# Patient Record
Sex: Female | Born: 1970 | Race: Black or African American | Hispanic: No | Marital: Single | Smoking: Never smoker
Health system: Southern US, Community
[De-identification: ages and names within clinical notes are randomized; demographics above are authoritative.]

## PROBLEM LIST (undated history)

## (undated) DIAGNOSIS — S82142A Displaced bicondylar fracture of left tibia, initial encounter for closed fracture: Secondary | ICD-10-CM

## (undated) DIAGNOSIS — I10 Essential (primary) hypertension: Secondary | ICD-10-CM

## (undated) DIAGNOSIS — M1990 Unspecified osteoarthritis, unspecified site: Secondary | ICD-10-CM

## (undated) DIAGNOSIS — R011 Cardiac murmur, unspecified: Secondary | ICD-10-CM

---
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
## 1997-10-15 ENCOUNTER — Emergency Department (HOSPITAL_COMMUNITY): Admission: EM | Admit: 1997-10-15 | Discharge: 1997-10-15 | Payer: Self-pay | Admitting: Emergency Medicine

## 1997-10-27 ENCOUNTER — Inpatient Hospital Stay (HOSPITAL_COMMUNITY): Admission: AD | Admit: 1997-10-27 | Discharge: 1997-10-27 | Payer: Self-pay | Admitting: Obstetrics

## 1998-10-05 ENCOUNTER — Emergency Department (HOSPITAL_COMMUNITY): Admission: EM | Admit: 1998-10-05 | Discharge: 1998-10-05 | Payer: Self-pay | Admitting: Emergency Medicine

## 1998-12-14 ENCOUNTER — Inpatient Hospital Stay (HOSPITAL_COMMUNITY): Admission: AD | Admit: 1998-12-14 | Discharge: 1998-12-14 | Payer: Self-pay | Admitting: Obstetrics & Gynecology

## 1999-05-27 ENCOUNTER — Encounter: Payer: Self-pay | Admitting: *Deleted

## 1999-05-27 ENCOUNTER — Emergency Department (HOSPITAL_COMMUNITY): Admission: EM | Admit: 1999-05-27 | Discharge: 1999-05-27 | Payer: Self-pay | Admitting: *Deleted

## 1999-07-13 ENCOUNTER — Encounter: Admission: RE | Admit: 1999-07-13 | Discharge: 1999-10-11 | Payer: Self-pay | Admitting: Orthopedic Surgery

## 1999-10-04 ENCOUNTER — Inpatient Hospital Stay (HOSPITAL_COMMUNITY): Admission: EM | Admit: 1999-10-04 | Discharge: 1999-10-04 | Payer: Self-pay | Admitting: *Deleted

## 2002-04-09 HISTORY — PX: HIP SURGERY: SHX245

## 2003-11-17 ENCOUNTER — Inpatient Hospital Stay (HOSPITAL_COMMUNITY): Admission: AD | Admit: 2003-11-17 | Discharge: 2003-11-17 | Payer: Self-pay | Admitting: Obstetrics and Gynecology

## 2004-03-29 ENCOUNTER — Inpatient Hospital Stay (HOSPITAL_COMMUNITY): Admission: AD | Admit: 2004-03-29 | Discharge: 2004-03-29 | Payer: Self-pay | Admitting: *Deleted

## 2009-11-10 ENCOUNTER — Inpatient Hospital Stay (HOSPITAL_COMMUNITY): Admission: AD | Admit: 2009-11-10 | Discharge: 2009-11-10 | Payer: Self-pay | Admitting: Obstetrics & Gynecology

## 2009-11-10 ENCOUNTER — Ambulatory Visit: Payer: Self-pay | Admitting: Obstetrics and Gynecology

## 2009-11-24 ENCOUNTER — Ambulatory Visit: Payer: Self-pay | Admitting: Obstetrics and Gynecology

## 2009-12-22 ENCOUNTER — Inpatient Hospital Stay (HOSPITAL_COMMUNITY): Admission: AD | Admit: 2009-12-22 | Discharge: 2009-12-22 | Payer: Self-pay | Admitting: Obstetrics & Gynecology

## 2009-12-26 ENCOUNTER — Ambulatory Visit (HOSPITAL_COMMUNITY): Admission: RE | Admit: 2009-12-26 | Discharge: 2009-12-26 | Payer: Self-pay | Admitting: Obstetrics and Gynecology

## 2010-04-29 ENCOUNTER — Encounter: Payer: Self-pay | Admitting: Obstetrics and Gynecology

## 2010-04-30 ENCOUNTER — Encounter: Payer: Self-pay | Admitting: *Deleted

## 2010-04-30 ENCOUNTER — Encounter: Payer: Self-pay | Admitting: Obstetrics and Gynecology

## 2010-06-23 LAB — URINALYSIS, ROUTINE W REFLEX MICROSCOPIC
Bilirubin Urine: NEGATIVE
Glucose, UA: NEGATIVE mg/dL
Hgb urine dipstick: NEGATIVE
Ketones, ur: 15 mg/dL — AB
Protein, ur: NEGATIVE mg/dL
pH: 6.5 (ref 5.0–8.0)

## 2010-06-23 LAB — GC/CHLAMYDIA PROBE AMP, GENITAL: Chlamydia, DNA Probe: NEGATIVE

## 2010-06-23 LAB — CA 125: CA 125: 24.6 U/mL (ref 0.0–30.2)

## 2013-09-13 ENCOUNTER — Emergency Department (HOSPITAL_COMMUNITY)
Admission: EM | Admit: 2013-09-13 | Discharge: 2013-09-14 | Disposition: A | Discharge status: Home / Self Care | Payer: Medicaid Other | Attending: Emergency Medicine | Admitting: Emergency Medicine

## 2013-09-13 ENCOUNTER — Emergency Department (HOSPITAL_COMMUNITY): Payer: Medicaid Other

## 2013-09-13 ENCOUNTER — Encounter (HOSPITAL_COMMUNITY): Payer: Self-pay | Admitting: Emergency Medicine

## 2013-09-13 DIAGNOSIS — I10 Essential (primary) hypertension: Secondary | ICD-10-CM | POA: Diagnosis not present

## 2013-09-13 DIAGNOSIS — IMO0002 Reserved for concepts with insufficient information to code with codable children: Secondary | ICD-10-CM | POA: Insufficient documentation

## 2013-09-13 DIAGNOSIS — S8990XA Unspecified injury of unspecified lower leg, initial encounter: Secondary | ICD-10-CM | POA: Diagnosis present

## 2013-09-13 DIAGNOSIS — S82109A Unspecified fracture of upper end of unspecified tibia, initial encounter for closed fracture: Secondary | ICD-10-CM | POA: Diagnosis not present

## 2013-09-13 DIAGNOSIS — Y92410 Unspecified street and highway as the place of occurrence of the external cause: Secondary | ICD-10-CM | POA: Diagnosis not present

## 2013-09-13 DIAGNOSIS — S79919A Unspecified injury of unspecified hip, initial encounter: Secondary | ICD-10-CM | POA: Insufficient documentation

## 2013-09-13 DIAGNOSIS — Z9889 Other specified postprocedural states: Secondary | ICD-10-CM | POA: Insufficient documentation

## 2013-09-13 DIAGNOSIS — Z3202 Encounter for pregnancy test, result negative: Secondary | ICD-10-CM | POA: Diagnosis not present

## 2013-09-13 DIAGNOSIS — S79929A Unspecified injury of unspecified thigh, initial encounter: Secondary | ICD-10-CM

## 2013-09-13 DIAGNOSIS — T07XXXA Unspecified multiple injuries, initial encounter: Secondary | ICD-10-CM | POA: Insufficient documentation

## 2013-09-13 DIAGNOSIS — S82142A Displaced bicondylar fracture of left tibia, initial encounter for closed fracture: Secondary | ICD-10-CM

## 2013-09-13 DIAGNOSIS — Y9389 Activity, other specified: Secondary | ICD-10-CM | POA: Insufficient documentation

## 2013-09-13 DIAGNOSIS — S3981XA Other specified injuries of abdomen, initial encounter: Secondary | ICD-10-CM | POA: Insufficient documentation

## 2013-09-13 DIAGNOSIS — R110 Nausea: Secondary | ICD-10-CM | POA: Insufficient documentation

## 2013-09-13 DIAGNOSIS — S0993XA Unspecified injury of face, initial encounter: Secondary | ICD-10-CM | POA: Diagnosis not present

## 2013-09-13 DIAGNOSIS — S199XXA Unspecified injury of neck, initial encounter: Secondary | ICD-10-CM

## 2013-09-13 DIAGNOSIS — S99929A Unspecified injury of unspecified foot, initial encounter: Secondary | ICD-10-CM | POA: Diagnosis present

## 2013-09-13 DIAGNOSIS — S0990XA Unspecified injury of head, initial encounter: Secondary | ICD-10-CM | POA: Insufficient documentation

## 2013-09-13 HISTORY — DX: Essential (primary) hypertension: I10

## 2013-09-13 LAB — POC URINE PREG, ED: Preg Test, Ur: NEGATIVE

## 2013-09-13 MED ORDER — OXYCODONE-ACETAMINOPHEN 5-325 MG PO TABS
1.0000 | ORAL_TABLET | Freq: Once | ORAL | Status: AC
  Administered 2013-09-13: 1 via ORAL
  Filled 2013-09-13: qty 1
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
## 2013-09-13 NOTE — ED Notes (Signed)
Patient transported to CT 

## 2013-09-14 MED ORDER — OXYCODONE-ACETAMINOPHEN 5-325 MG PO TABS: 1.0000 | ORAL_TABLET | ORAL | Status: DC | PRN

## 2013-09-14 MED ORDER — IBUPROFEN 800 MG PO TABS: 800.0000 mg | ORAL_TABLET | Freq: Three times a day (TID) | ORAL | Status: DC

## 2013-09-14 NOTE — Discharge Instructions (Signed)
Call Dr. Greig Right office in the morning to see what time he wants you to come to the office. Return here as needed for problems.

## 2013-09-14 NOTE — ED Notes (Signed)
Ortho tech paged  

## 2013-09-14 NOTE — Progress Notes (Signed)
Orthopedic Tech Progress Note Patient Details:  Deanna Curtis 1970/11/12 [id number]  Ortho Devices Type of Ortho Device: Knee Immobilizer;Crutches;Knee Sleeve   Haskell Flirt 09/14/2013, 12:51 AM

## 2013-09-14 NOTE — ED Notes (Signed)
Pt refused vitals recheck, wanted to go to pediatrics ED to be with her daughter being seen.

## 2013-09-24 ENCOUNTER — Ambulatory Visit (HOSPITAL_COMMUNITY)
Admission: RE | Admit: 2013-09-24 | Discharge: 2013-09-24 | Disposition: A | Discharge status: Home / Self Care | Payer: Medicaid Other | Source: Ambulatory Visit | Attending: Cardiovascular Disease | Admitting: Cardiovascular Disease

## 2013-09-24 DIAGNOSIS — M79605 Pain in left leg: Secondary | ICD-10-CM

## 2013-09-24 DIAGNOSIS — Y929 Unspecified place or not applicable: Secondary | ICD-10-CM | POA: Insufficient documentation

## 2013-09-24 DIAGNOSIS — M79609 Pain in unspecified limb: Secondary | ICD-10-CM

## 2013-09-24 DIAGNOSIS — S82109A Unspecified fracture of upper end of unspecified tibia, initial encounter for closed fracture: Secondary | ICD-10-CM | POA: Insufficient documentation

## 2013-09-24 DIAGNOSIS — M7989 Other specified soft tissue disorders: Secondary | ICD-10-CM

## 2013-09-24 DIAGNOSIS — X58XXXA Exposure to other specified factors, initial encounter: Secondary | ICD-10-CM | POA: Insufficient documentation

## 2013-09-24 NOTE — ED Provider Notes (Signed)
Medical screening examination/treatment/procedure(s) were performed by non-physician practitioner and as supervising physician I was immediately available for consultation/collaboration.   EKG Interpretation None        Raya Mckinstry, MD 09/24/13 0014 

## 2013-09-24 NOTE — Progress Notes (Signed)
Venous Duplex Left Lowe Ext. Completed. Negative for DVT. Deanna Curtis, BS, RDMS, RVT

## 2013-09-25 ENCOUNTER — Telehealth (HOSPITAL_COMMUNITY): Payer: Self-pay | Admitting: *Deleted

## 2013-09-28 ENCOUNTER — Other Ambulatory Visit: Payer: Self-pay | Admitting: Orthopedic Surgery

## 2013-09-28 ENCOUNTER — Encounter (HOSPITAL_BASED_OUTPATIENT_CLINIC_OR_DEPARTMENT_OTHER): Payer: Self-pay | Admitting: *Deleted
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
## 2013-10-02 ENCOUNTER — Ambulatory Visit (HOSPITAL_BASED_OUTPATIENT_CLINIC_OR_DEPARTMENT_OTHER)
Admission: RE | Admit: 2013-10-02 | Discharge: 2013-10-03 | Disposition: A | Discharge status: Home / Self Care | Payer: Medicaid Other | Source: Ambulatory Visit | Attending: Orthopedic Surgery | Admitting: Orthopedic Surgery

## 2013-10-02 ENCOUNTER — Encounter (HOSPITAL_BASED_OUTPATIENT_CLINIC_OR_DEPARTMENT_OTHER): Payer: Medicaid Other | Admitting: Anesthesiology

## 2013-10-02 ENCOUNTER — Encounter (HOSPITAL_BASED_OUTPATIENT_CLINIC_OR_DEPARTMENT_OTHER)
Admission: RE | Disposition: A | Discharge status: Home / Self Care | Payer: Self-pay | Source: Ambulatory Visit | Attending: Orthopedic Surgery

## 2013-10-02 ENCOUNTER — Encounter (HOSPITAL_BASED_OUTPATIENT_CLINIC_OR_DEPARTMENT_OTHER): Payer: Self-pay

## 2013-10-02 ENCOUNTER — Other Ambulatory Visit: Payer: Self-pay

## 2013-10-02 ENCOUNTER — Ambulatory Visit (HOSPITAL_COMMUNITY): Payer: Medicaid Other

## 2013-10-02 ENCOUNTER — Ambulatory Visit (HOSPITAL_BASED_OUTPATIENT_CLINIC_OR_DEPARTMENT_OTHER): Payer: Medicaid Other | Admitting: Anesthesiology

## 2013-10-02 DIAGNOSIS — S82109A Unspecified fracture of upper end of unspecified tibia, initial encounter for closed fracture: Secondary | ICD-10-CM | POA: Diagnosis present

## 2013-10-02 DIAGNOSIS — Z96649 Presence of unspecified artificial hip joint: Secondary | ICD-10-CM | POA: Insufficient documentation

## 2013-10-02 DIAGNOSIS — I10 Essential (primary) hypertension: Secondary | ICD-10-CM | POA: Insufficient documentation

## 2013-10-02 DIAGNOSIS — S82142A Displaced bicondylar fracture of left tibia, initial encounter for closed fracture: Secondary | ICD-10-CM

## 2013-10-02 HISTORY — DX: Unspecified osteoarthritis, unspecified site: M19.90

## 2013-10-02 HISTORY — PX: ORIF TIBIA PLATEAU: SHX2132

## 2013-10-02 HISTORY — DX: Displaced bicondylar fracture of left tibia, initial encounter for closed fracture: S82.142A

## 2013-10-02 SURGERY — OPEN REDUCTION INTERNAL FIXATION (ORIF) TIBIAL PLATEAU
Anesthesia: General | Laterality: Left

## 2013-10-02 MED ORDER — BISOPROLOL-HYDROCHLOROTHIAZIDE 5-6.25 MG PO TABS
1.0000 | ORAL_TABLET | Freq: Every day | ORAL | Status: DC
  Administered 2013-10-02 (×2): 1 via ORAL

## 2013-10-02 MED ORDER — MIDAZOLAM HCL 2 MG/2ML IJ SOLN: 1.0000 mg | INTRAMUSCULAR | Status: DC | PRN

## 2013-10-02 MED ORDER — SENNA 8.6 MG PO TABS
1.0000 | ORAL_TABLET | Freq: Two times a day (BID) | ORAL | Status: DC
  Administered 2013-10-03: 8.6 mg via ORAL
  Filled 2013-10-02: qty 1

## 2013-10-02 MED ORDER — METHOCARBAMOL 500 MG PO TABS
500.0000 mg | ORAL_TABLET | Freq: Four times a day (QID) | ORAL | Status: DC
Start: 2013-10-02 — End: 2020-12-13

## 2013-10-02 MED ORDER — OXYCODONE HCL 5 MG PO TABS
5.0000 mg | ORAL_TABLET | Freq: Once | ORAL | Status: AC | PRN
  Administered 2013-10-02: 5 mg via ORAL

## 2013-10-02 MED ORDER — FENTANYL CITRATE 0.05 MG/ML IJ SOLN
INTRAMUSCULAR | Status: AC
  Filled 2013-10-02: qty 4

## 2013-10-02 MED ORDER — ONDANSETRON HCL 4 MG/2ML IJ SOLN: 4.0000 mg | Freq: Four times a day (QID) | INTRAMUSCULAR | Status: DC | PRN

## 2013-10-02 MED ORDER — PROMETHAZINE HCL 25 MG PO TABS: 25.0000 mg | ORAL_TABLET | Freq: Four times a day (QID) | ORAL | Status: DC | PRN

## 2013-10-02 MED ORDER — OXYCODONE-ACETAMINOPHEN 10-325 MG PO TABS: 1.0000 | ORAL_TABLET | Freq: Four times a day (QID) | ORAL | Status: DC | PRN

## 2013-10-02 MED ORDER — HYDROMORPHONE HCL PF 1 MG/ML IJ SOLN
INTRAMUSCULAR | Status: AC
  Filled 2013-10-02: qty 1

## 2013-10-02 MED ORDER — PROPOFOL 10 MG/ML IV BOLUS
INTRAVENOUS | Status: AC
  Filled 2013-10-02: qty 20

## 2013-10-02 MED ORDER — PROMETHAZINE HCL 25 MG/ML IJ SOLN
6.2500 mg | INTRAMUSCULAR | Status: DC | PRN
  Administered 2013-10-02: 12.5 mg via INTRAVENOUS
  Filled 2013-10-02 (×2): qty 1

## 2013-10-02 MED ORDER — ONDANSETRON HCL 4 MG/2ML IJ SOLN
INTRAMUSCULAR | Status: DC | PRN
  Administered 2013-10-02: 4 mg via INTRAVENOUS

## 2013-10-02 MED ORDER — FENTANYL CITRATE 0.05 MG/ML IJ SOLN
INTRAMUSCULAR | Status: AC
  Filled 2013-10-02: qty 6

## 2013-10-02 MED ORDER — METOCLOPRAMIDE HCL 5 MG PO TABS: 5.0000 mg | ORAL_TABLET | Freq: Three times a day (TID) | ORAL | Status: DC | PRN

## 2013-10-02 MED ORDER — CEFAZOLIN SODIUM-DEXTROSE 2-3 GM-% IV SOLR
2.0000 g | INTRAVENOUS | Status: AC
  Administered 2013-10-02: 2 g via INTRAVENOUS

## 2013-10-02 MED ORDER — ONDANSETRON HCL 4 MG PO TABS: 4.0000 mg | ORAL_TABLET | Freq: Four times a day (QID) | ORAL | Status: DC | PRN

## 2013-10-02 MED ORDER — MEDROXYPROGESTERONE ACETATE 150 MG/ML IM SUSP: 150.0000 mg | INTRAMUSCULAR | Status: DC

## 2013-10-02 MED ORDER — MIDAZOLAM HCL 2 MG/2ML IJ SOLN
INTRAMUSCULAR | Status: AC
  Filled 2013-10-02: qty 2

## 2013-10-02 MED ORDER — FENTANYL CITRATE 0.05 MG/ML IJ SOLN
INTRAMUSCULAR | Status: AC
  Filled 2013-10-02: qty 2

## 2013-10-02 MED ORDER — METHOCARBAMOL 500 MG PO TABS
500.0000 mg | ORAL_TABLET | Freq: Four times a day (QID) | ORAL | Status: DC | PRN
  Administered 2013-10-02 – 2013-10-03 (×2): 500 mg via ORAL
  Filled 2013-10-02 (×2): qty 1

## 2013-10-02 MED ORDER — HYDROMORPHONE HCL PF 1 MG/ML IJ SOLN
0.5000 mg | INTRAMUSCULAR | Status: DC | PRN
  Administered 2013-10-02 – 2013-10-03 (×4): 1 mg via INTRAVENOUS
  Filled 2013-10-02 (×4): qty 1

## 2013-10-02 MED ORDER — METOCLOPRAMIDE HCL 5 MG/ML IJ SOLN: 5.0000 mg | Freq: Three times a day (TID) | INTRAMUSCULAR | Status: DC | PRN

## 2013-10-02 MED ORDER — DOCUSATE SODIUM 100 MG PO CAPS
100.0000 mg | ORAL_CAPSULE | Freq: Two times a day (BID) | ORAL | Status: DC
  Administered 2013-10-03: 100 mg via ORAL
  Filled 2013-10-02: qty 1

## 2013-10-02 MED ORDER — METHOCARBAMOL 1000 MG/10ML IJ SOLN: 500.0000 mg | Freq: Four times a day (QID) | INTRAMUSCULAR | Status: DC | PRN

## 2013-10-02 MED ORDER — OXYCODONE HCL 5 MG/5ML PO SOLN: 5.0000 mg | Freq: Once | ORAL | Status: AC | PRN

## 2013-10-02 MED ORDER — SODIUM CHLORIDE 0.9 % IV SOLN: INTRAVENOUS | Status: DC

## 2013-10-02 MED ORDER — OXYCODONE-ACETAMINOPHEN 5-325 MG PO TABS
1.0000 | ORAL_TABLET | ORAL | Status: DC | PRN
  Administered 2013-10-03: 2 via ORAL
  Administered 2013-10-03: 1 via ORAL
  Filled 2013-10-02: qty 2
  Filled 2013-10-02: qty 1

## 2013-10-02 MED ORDER — RIVAROXABAN 10 MG PO TABS: 10.0000 mg | ORAL_TABLET | Freq: Every day | ORAL | Status: DC

## 2013-10-02 MED ORDER — CEFAZOLIN SODIUM 1-5 GM-% IV SOLN
INTRAVENOUS | Status: AC
  Filled 2013-10-02: qty 50

## 2013-10-02 MED ORDER — SENNA-DOCUSATE SODIUM 8.6-50 MG PO TABS: 2.0000 | ORAL_TABLET | Freq: Every day | ORAL | Status: DC

## 2013-10-02 MED ORDER — CEFAZOLIN SODIUM 1-5 GM-% IV SOLN
1.0000 g | Freq: Four times a day (QID) | INTRAVENOUS | Status: DC
  Administered 2013-10-02 – 2013-10-03 (×2): 1 g via INTRAVENOUS

## 2013-10-02 MED ORDER — OXYCODONE HCL 5 MG PO TABS
5.0000 mg | ORAL_TABLET | ORAL | Status: DC | PRN
  Administered 2013-10-02: 10 mg via ORAL
  Filled 2013-10-02: qty 2

## 2013-10-02 MED ORDER — CEFAZOLIN SODIUM-DEXTROSE 2-3 GM-% IV SOLR
INTRAVENOUS | Status: AC
  Filled 2013-10-02: qty 50

## 2013-10-02 MED ORDER — FENTANYL CITRATE 0.05 MG/ML IJ SOLN: 50.0000 ug | INTRAMUSCULAR | Status: DC | PRN

## 2013-10-02 MED ORDER — LACTATED RINGERS IV SOLN
INTRAVENOUS | Status: DC
  Administered 2013-10-02 (×2): via INTRAVENOUS

## 2013-10-02 MED ORDER — HYDROMORPHONE HCL PF 1 MG/ML IJ SOLN
0.2500 mg | INTRAMUSCULAR | Status: DC | PRN
  Administered 2013-10-02 (×3): 0.5 mg via INTRAVENOUS

## 2013-10-02 MED ORDER — PROPOFOL 10 MG/ML IV BOLUS
INTRAVENOUS | Status: DC | PRN
  Administered 2013-10-02: 160 mg via INTRAVENOUS

## 2013-10-02 MED ORDER — LIDOCAINE HCL (CARDIAC) 20 MG/ML IV SOLN
INTRAVENOUS | Status: DC | PRN
  Administered 2013-10-02: 60 mg via INTRAVENOUS

## 2013-10-02 MED ORDER — FENTANYL CITRATE 0.05 MG/ML IJ SOLN
INTRAMUSCULAR | Status: DC | PRN
  Administered 2013-10-02: 100 ug via INTRAVENOUS
  Administered 2013-10-02 (×8): 50 ug via INTRAVENOUS

## 2013-10-02 MED ORDER — BUPIVACAINE HCL (PF) 0.5 % IJ SOLN
INTRAMUSCULAR | Status: DC | PRN
  Administered 2013-10-02: 20 mL

## 2013-10-02 MED ORDER — MIDAZOLAM HCL 5 MG/5ML IJ SOLN
INTRAMUSCULAR | Status: DC | PRN
  Administered 2013-10-02: 2 mg via INTRAVENOUS

## 2013-10-02 MED ORDER — OXYCODONE HCL 5 MG PO TABS
ORAL_TABLET | ORAL | Status: AC
  Filled 2013-10-02: qty 1

## 2013-10-02 MED ORDER — BUPIVACAINE HCL (PF) 0.5 % IJ SOLN
INTRAMUSCULAR | Status: AC
  Filled 2013-10-02: qty 30

## 2013-10-02 MED ORDER — DEXAMETHASONE SODIUM PHOSPHATE 10 MG/ML IJ SOLN
INTRAMUSCULAR | Status: DC | PRN
  Administered 2013-10-02: 10 mg via INTRAVENOUS

## 2013-10-02 SURGICAL SUPPLY — 79 items
3.5mm LCP Medial Proximal Tibia Plate ×2 IMPLANT
APL SKNCLS STERI-STRIP NONHPOA (GAUZE/BANDAGES/DRESSINGS) ×1
BANDAGE ELASTIC 4 VELCRO ST LF (GAUZE/BANDAGES/DRESSINGS) ×1 IMPLANT
BANDAGE ELASTIC 6 VELCRO ST LF (GAUZE/BANDAGES/DRESSINGS) ×3 IMPLANT
BANDAGE ESMARK 6X9 LF (GAUZE/BANDAGES/DRESSINGS) ×1 IMPLANT
BENZOIN TINCTURE PRP APPL 2/3 (GAUZE/BANDAGES/DRESSINGS) ×3 IMPLANT
BIT DRILL SOLID 2.5X110 (BIT) ×2 IMPLANT
BIT DRILL SOLID 2.8X165 (BIT) ×2 IMPLANT
BLADE SURG 10 STRL SS (BLADE) ×3 IMPLANT
BLADE SURG 15 STRL LF DISP TIS (BLADE) ×3 IMPLANT
BLADE SURG 15 STRL SS (BLADE) ×6
BNDG CMPR 9X6 STRL LF SNTH (GAUZE/BANDAGES/DRESSINGS) ×1
BNDG ESMARK 6X9 LF (GAUZE/BANDAGES/DRESSINGS) ×3
CANISTER SUCT 1200ML W/VALVE (MISCELLANEOUS) IMPLANT
CLOSURE WOUND 1/2 X4 (GAUZE/BANDAGES/DRESSINGS) ×1
CUFF TOURNIQUET SINGLE 34IN LL (TOURNIQUET CUFF) ×2 IMPLANT
DECANTER SPIKE VIAL GLASS SM (MISCELLANEOUS) IMPLANT
DRAPE C-ARM 42X72 X-RAY (DRAPES) ×3 IMPLANT
DRAPE C-ARMOR (DRAPES) ×2 IMPLANT
DRAPE EXTREMITY T 121X128X90 (DRAPE) ×3 IMPLANT
DRAPE U-SHAPE 47X51 STRL (DRAPES) ×3 IMPLANT
DRSG PAD ABDOMINAL 8X10 ST (GAUZE/BANDAGES/DRESSINGS) ×3 IMPLANT
DURAPREP 26ML APPLICATOR (WOUND CARE) ×3 IMPLANT
ELECT REM PT RETURN 9FT ADLT (ELECTROSURGICAL) ×3
ELECTRODE REM PT RTRN 9FT ADLT (ELECTROSURGICAL) ×1 IMPLANT
GAUZE SPONGE 4X4 12PLY STRL (GAUZE/BANDAGES/DRESSINGS) ×3 IMPLANT
GLOVE BIO SURGEON STRL SZ 6.5 (GLOVE) ×1 IMPLANT
GLOVE BIO SURGEON STRL SZ8 (GLOVE) ×3 IMPLANT
GLOVE BIO SURGEONS STRL SZ 6.5 (GLOVE) ×1
GLOVE BIOGEL PI IND STRL 7.0 (GLOVE) IMPLANT
GLOVE BIOGEL PI IND STRL 8 (GLOVE) ×2 IMPLANT
GLOVE BIOGEL PI INDICATOR 7.0 (GLOVE) ×2
GLOVE BIOGEL PI INDICATOR 8 (GLOVE) ×4
GLOVE ORTHO TXT STRL SZ7.5 (GLOVE) ×3 IMPLANT
GOWN STRL REUS W/ TWL LRG LVL3 (GOWN DISPOSABLE) ×1 IMPLANT
GOWN STRL REUS W/ TWL XL LVL3 (GOWN DISPOSABLE) ×2 IMPLANT
GOWN STRL REUS W/TWL LRG LVL3 (GOWN DISPOSABLE) ×3
GOWN STRL REUS W/TWL XL LVL3 (GOWN DISPOSABLE) ×6
IMMOBILIZER KNEE 22 UNIV (SOFTGOODS) ×1 IMPLANT
IMMOBILIZER KNEE 24 THIGH 36 (MISCELLANEOUS) ×1 IMPLANT
IMMOBILIZER KNEE 24 UNIV (MISCELLANEOUS) ×3
NDL HYPO 25X1 1.5 SAFETY (NEEDLE) IMPLANT
NEEDLE HYPO 25X1 1.5 SAFETY (NEEDLE) IMPLANT
NS IRRIG 1000ML POUR BTL (IV SOLUTION) ×3 IMPLANT
PACK ARTHROSCOPY DSU (CUSTOM PROCEDURE TRAY) ×3 IMPLANT
PACK BASIN DAY SURGERY FS (CUSTOM PROCEDURE TRAY) ×3 IMPLANT
PENCIL BUTTON HOLSTER BLD 10FT (ELECTRODE) ×3 IMPLANT
SCREW CORTEX 3.5 32MM (Screw) ×2 IMPLANT
SCREW CORTEX 3.5 36MM (Screw) ×2 IMPLANT
SCREW LOCK CORT ST 3.5X32 (Screw) IMPLANT
SCREW LOCK CORT ST 3.5X36 (Screw) IMPLANT
SCREW LOCKING SLF TAP 3.5X70MM (Screw) ×2 IMPLANT
SCREW PELVIC CORT ST 3.5X70 (Screw) IMPLANT
SCREW SELF TAP 3.5 70M (Screw) ×2 IMPLANT
SCREW SELF TAP 65MM (Screw) ×6 IMPLANT
SHEET MEDIUM DRAPE 40X70 STRL (DRAPES) ×3 IMPLANT
SLEEVE SCD COMPRESS KNEE MED (MISCELLANEOUS) ×3 IMPLANT
SPLINT FAST PLASTER 5X30 (CAST SUPPLIES)
SPLINT PLASTER CAST FAST 5X30 (CAST SUPPLIES) IMPLANT
SPONGE LAP 18X18 X RAY DECT (DISPOSABLE) ×4 IMPLANT
STAPLER VISISTAT 35W (STAPLE) IMPLANT
STRIP CLOSURE SKIN 1/2X4 (GAUZE/BANDAGES/DRESSINGS) ×2 IMPLANT
SUCTION FRAZIER TIP 10 FR DISP (SUCTIONS) ×2 IMPLANT
SUT ETHILON 3 0 PS 1 (SUTURE) IMPLANT
SUT ETHILON 4 0 PS 2 18 (SUTURE) IMPLANT
SUT MNCRL AB 4-0 PS2 18 (SUTURE) IMPLANT
SUT VIC AB 0 CT1 27 (SUTURE) ×3
SUT VIC AB 0 CT1 27XBRD ANBCTR (SUTURE) ×1 IMPLANT
SUT VIC AB 2-0 SH 18 (SUTURE) IMPLANT
SUT VIC AB 3-0 SH 27 (SUTURE)
SUT VIC AB 3-0 SH 27X BRD (SUTURE) IMPLANT
SUT VICRYL 3-0 CR8 SH (SUTURE) ×3 IMPLANT
SUT VICRYL 4-0 PS2 18IN ABS (SUTURE) ×4 IMPLANT
SYR BULB IRRIGATION 50ML (SYRINGE) ×3 IMPLANT
SYR CONTROL 10ML LL (SYRINGE) IMPLANT
TUBE CONNECTING 20'X1/4 (TUBING) ×1
TUBE CONNECTING 20X1/4 (TUBING) ×1 IMPLANT
UNDERPAD 30X30 INCONTINENT (UNDERPADS AND DIAPERS) ×3 IMPLANT
YANKAUER SUCT BULB TIP NO VENT (SUCTIONS) IMPLANT

## 2013-10-02 NOTE — Discharge Instructions (Signed)
Diet: As you were doing prior to hospitalization  ° °Shower:  May shower but keep the wounds dry, use an occlusive plastic wrap, NO SOAKING IN TUB.  If the bandage gets wet, change with a clean dry gauze. ° °Dressing:  You may change your dressing 3-5 days after surgery.  Then change the dressing daily with sterile gauze dressing.   ° °There are sticky tapes (steri-strips) on your wounds and all the stitches are absorbable.  Leave the steri-strips in place when changing your dressings, they will peel off with time, usually 2-3 weeks. ° °Activity:  Increase activity slowly as tolerated, but follow the weight bearing instructions below.  No lifting or driving for 6 weeks. ° °Weight Bearing:   Non weight bearing.   ° °To prevent constipation: you may use a stool softener such as - ° °Colace (over the counter) 100 mg by mouth twice a day  °Drink plenty of fluids (prune juice may be helpful) and high fiber foods °Miralax (over the counter) for constipation as needed.   ° °Itching:  If you experience itching with your medications, try taking only a single pain pill, or even half a pain pill at a time.  You may take up to 10 pain pills per day, and you can also use benadryl over the counter for itching or also to help with sleep.  ° °Precautions:  If you experience chest pain or shortness of breath - call 911 immediately for transfer to the hospital emergency department!! ° °If you develop a fever greater that 101 F, purulent drainage from wound, increased redness or drainage from wound, or calf pain -- Call the office at [phone]                                                °Follow- Up Appointment:  Please call for an appointment to be seen in 2 weeks [place] - [phone] ° ° ° ° ° °

## 2013-10-02 NOTE — Anesthesia Postprocedure Evaluation (Signed)
  Anesthesia Post-op Note  Patient: Deanna Curtis  Procedure(s) Performed: Procedure(s): LEFT OPEN REDUCTION INTERNAL FIXATION (ORIF) TIBIAL PLATEAU (Left)  Patient Location: PACU  Anesthesia Type:General  Level of Consciousness: awake and alert   Airway and Oxygen Therapy: Patient Spontanous Breathing  Post-op Pain: mild  Post-op Assessment: Post-op Vital signs reviewed  Post-op Vital Signs: stable  Last Vitals:  Filed Vitals:   10/02/13 1615  BP: 178/107  Pulse: 79  Temp:   Resp: 20    Complications: No apparent anesthesia complications

## 2013-10-02 NOTE — Anesthesia Preprocedure Evaluation (Addendum)
Anesthesia Evaluation  Patient identified by MRN, date of birth, ID band Patient awake    Reviewed: Allergy & Precautions, H&P , NPO status , Patient's Chart, lab work & pertinent test results  History of Anesthesia Complications Negative for: history of anesthetic complications  Airway Mallampati: I  Neck ROM: Full    Dental  (+) Teeth Intact   Pulmonary neg pulmonary ROS,  breath sounds clear to auscultation        Cardiovascular hypertension, Rhythm:Regular Rate:Normal     Neuro/Psych negative neurological ROS     GI/Hepatic negative GI ROS, Neg liver ROS,   Endo/Other    Renal/GU negative Renal ROS     Musculoskeletal   Abdominal   Peds  Hematology   Anesthesia Other Findings   Reproductive/Obstetrics                         Anesthesia Physical Anesthesia Plan  ASA: II  Anesthesia Plan: General   Post-op Pain Management:    Induction: Intravenous  Airway Management Planned: LMA  Additional Equipment:   Intra-op Plan:   Post-operative Plan: Extubation in OR  Informed Consent: I have reviewed the patients History and Physical, chart, labs and discussed the procedure including the risks, benefits and alternatives for the proposed anesthesia with the patient or authorized representative who has indicated his/her understanding and acceptance.   Dental advisory given  Plan Discussed with: CRNA and Surgeon  Anesthesia Plan Comments:         Anesthesia Quick Evaluation

## 2013-10-02 NOTE — Transfer of Care (Signed)
Immediate Anesthesia Transfer of Care Note  Patient: Deanna Curtis  Procedure(s) Performed: Procedure(s): LEFT OPEN REDUCTION INTERNAL FIXATION (ORIF) TIBIAL PLATEAU (Left)  Patient Location: PACU  Anesthesia Type:General  Level of Consciousness: sedated  Airway & Oxygen Therapy: Patient Spontanous Breathing and Patient connected to face mask oxygen  Post-op Assessment: Report given to PACU RN and Post -op Vital signs reviewed and stable  Post vital signs: Reviewed and stable  Complications: No apparent anesthesia complications

## 2013-10-02 NOTE — Anesthesia Procedure Notes (Signed)
Procedure Name: LMA Insertion Date/Time: 10/02/2013 1:36 PM Performed by: Burna CashONRAD, JOSEPH C Pre-anesthesia Checklist: Patient identified, Emergency Drugs available, Suction available and Patient being monitored Patient Re-evaluated:Patient Re-evaluated prior to inductionOxygen Delivery Method: Circle System Utilized Preoxygenation: Pre-oxygenation with 100% oxygen Intubation Type: IV induction Ventilation: Mask ventilation without difficulty LMA: LMA inserted LMA Size: 4.0 Number of attempts: 1 Airway Equipment and Method: bite block Placement Confirmation: positive ETCO2 Tube secured with: Tape Dental Injury: Teeth and Oropharynx as per pre-operative assessment

## 2013-10-02 NOTE — Op Note (Signed)
10/02/2013  3:04 PM  PATIENT:  Deanna Curtis    PRE-OPERATIVE DIAGNOSIS:  Left bicondylar tibial plateau fracture  POST-OPERATIVE DIAGNOSIS:  Same  PROCEDURE:  Open reduction internal fixation left bicondylar tibial plateau fracture  SURGEON:  Eulas PostLANDAU,Taliya Mcclard P, MD  PHYSICIAN ASSISTANT: Janace LittenBrandon Parry, OPA-C, present and scrubbed throughout the case, critical for completion in a timely fashion, and for retraction, instrumentation, and closure.  ANESTHESIA:   General  PREOPERATIVE INDICATIONS:  Deanna Curtis is a  43 y.o. female with a diagnosis of left bicondylar tibial plateau fracture who failed conservative measures and elected for surgical management.  She had significant displacement, particularly on the midline.  The risks benefits and alternatives were discussed with the patient preoperatively including but not limited to the risks of infection, bleeding, nerve injury, cardiopulmonary complications, the need for revision surgery, among others, and the patient was willing to proceed. We also discussed the risk for posttraumatic arthritis, progression of knee pain, the need for further surgery, among others.  OPERATIVE IMPLANTS: Synthes 3.5 mm medial proximal tibial locking plate, T-shaped  OPERATIVE FINDINGS: Displaced medial condyle fracture, extending in the lateral side.  OPERATIVE PROCEDURE: The patient was brought to the operating room and placed in the supine position. General anesthesia was administered. IV antibiotics were given. The left lower extremity was prepped and draped in usual sterile fashion. Time out was performed. The knee itself had a fair amount of recurrent bottom from the fracture site prior to fixation.  Medial incision was made, dissection carried down up as anserine tendons were exposed, and the superficial layer investing the sartorius, and gracilis and semitendinosus was incised, however I left these tendons in place. The incision was made proximal to the  tendon insertion. I then made a deeper longitudinal incision through the layer below this, exposing the fracture site. The fracture was booked open with the osteotome, and I used a Cobb elevator going through the tibia across to the anteromedial aspect of the lateral tibial plateau. I elevated the depressed section of the lateral plateau under fluoroscopic vision.  I then reduced to the medial plateau, applied a medial plate, secured it with a K wire initially, confirm the height and position on AP and lateral views, and then secured the plate distally with a cortical screw, compressing it down over the as anserine tendons and the superficial tissue.  I then placed a cortical screw proximally through the locking jig, securing the head of the plate down to the proximal tibia.  Confirmation of position of the fracture the plate was made under multiple views, AP, lateral, internal, and external obliques using fluoroscopy, and then I completed the fixation of the fracture with locking screws proximally, and a cortical screw disc ability, followed by a locking screw in the kickstand screw. I changed the initial nonlocking proximal screw out to a low contour locking screw.  Excellent overall fixation was achieved, final C-arm pictures were taken, wounds were irrigated copiously, and I had already repaired the deepest layer of the fascia with Vicryl, and I also already repaired the as anserine layer with Vicryl, and then repaired the subcutaneous tissue with Vicryl with Monocryl and Steri-Strips for the skin. She was injected with Marcaine. She was then awakened and returned to the PACU in stable and satisfactory condition. Sterile gauze was also applied as well as a knee immobilizer. There were no complications and she tolerated the procedure well. She will nonweightbearing for a period of about 2 months.
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
## 2013-10-03 DIAGNOSIS — S82109A Unspecified fracture of upper end of unspecified tibia, initial encounter for closed fracture: Secondary | ICD-10-CM | POA: Diagnosis not present

## 2013-10-05 ENCOUNTER — Encounter (HOSPITAL_BASED_OUTPATIENT_CLINIC_OR_DEPARTMENT_OTHER): Payer: Self-pay | Admitting: Orthopedic Surgery

## 2013-10-06 LAB — POCT I-STAT, CHEM 8
BUN: 9 mg/dL (ref 6–23)
Calcium, Ion: 1.27 mmol/L — ABNORMAL HIGH (ref 1.12–1.23)
Chloride: 107 mEq/L (ref 96–112)
Creatinine, Ser: 0.7 mg/dL (ref 0.50–1.10)
Glucose, Bld: 95 mg/dL (ref 70–99)
HEMATOCRIT: 41 % (ref 36.0–46.0)
HEMOGLOBIN: 13.9 g/dL (ref 12.0–15.0)
POTASSIUM: 3.9 meq/L (ref 3.7–5.3)
SODIUM: 142 meq/L (ref 137–147)
TCO2: 22 mmol/L (ref 0–100)

## 2017-06-24 ENCOUNTER — Other Ambulatory Visit: Payer: Self-pay | Admitting: Pain Medicine

## 2017-06-24 DIAGNOSIS — M25559 Pain in unspecified hip: Secondary | ICD-10-CM

## 2017-06-24 DIAGNOSIS — M545 Low back pain: Secondary | ICD-10-CM

## 2018-06-17 ENCOUNTER — Ambulatory Visit: Payer: Self-pay | Admitting: Adult Health

## 2018-06-20 ENCOUNTER — Ambulatory Visit
Admission: RE | Admit: 2018-06-20 | Discharge: 2018-06-20 | Disposition: A | Discharge status: Home / Self Care | Payer: 59 | Attending: Adult Health | Admitting: Adult Health

## 2018-06-20 ENCOUNTER — Ambulatory Visit
Admission: RE | Admit: 2018-06-20 | Discharge: 2018-06-20 | Disposition: A | Discharge status: Home / Self Care | Payer: 59 | Source: Ambulatory Visit | Attending: Adult Health | Admitting: Adult Health

## 2018-06-20 ENCOUNTER — Ambulatory Visit (INDEPENDENT_AMBULATORY_CARE_PROVIDER_SITE_OTHER): Payer: PRIVATE HEALTH INSURANCE | Admitting: Adult Health

## 2018-06-20 ENCOUNTER — Encounter: Payer: Self-pay | Admitting: Adult Health

## 2018-06-20 ENCOUNTER — Other Ambulatory Visit: Payer: Self-pay

## 2018-06-20 VITALS — BP 142/88 | HR 82 | Resp 16 | Ht 67.0 in | Wt 151.0 lb

## 2018-06-20 DIAGNOSIS — R059 Cough, unspecified: Secondary | ICD-10-CM

## 2018-06-20 DIAGNOSIS — R05 Cough: Secondary | ICD-10-CM

## 2018-06-20 DIAGNOSIS — H6121 Impacted cerumen, right ear: Secondary | ICD-10-CM | POA: Diagnosis not present

## 2018-06-20 DIAGNOSIS — J45909 Unspecified asthma, uncomplicated: Secondary | ICD-10-CM

## 2018-06-20 DIAGNOSIS — J4540 Moderate persistent asthma, uncomplicated: Secondary | ICD-10-CM | POA: Diagnosis not present

## 2018-06-20 MED ORDER — MONTELUKAST SODIUM 10 MG PO TABS: 10.0000 mg | ORAL_TABLET | Freq: Every day | ORAL | 1 refills | Status: AC

## 2018-06-20 MED ORDER — ALBUTEROL SULFATE HFA 108 (90 BASE) MCG/ACT IN AERS: 2.0000 | INHALATION_SPRAY | Freq: Four times a day (QID) | RESPIRATORY_TRACT | 0 refills | Status: AC | PRN

## 2018-06-20 MED ORDER — FLUTICASONE FUROATE-VILANTEROL 200-25 MCG/INH IN AEPB: 1.0000 | INHALATION_SPRAY | Freq: Every day | RESPIRATORY_TRACT | 2 refills | Status: DC

## 2018-06-20 MED ORDER — CARBAMIDE PEROXIDE 6.5 % OT SOLN: 5.0000 [drp] | Freq: Two times a day (BID) | OTIC | 0 refills | Status: DC

## 2018-06-20 NOTE — Progress Notes (Signed)
[facility] [address] [place], [place] [address]  Pulmonary Sleep Medicine   Office Visit Note  Patient Name: Deanna Curtis DOB: 07/10/1970 MRN [id number]  Date of Service: 06/20/2018  Complaints/HPI: Pt is here to establish care.  She has a history of asthma, htn and seasonal allergies.  She reports coughing intermittently for 3 years.  She has been treated for allergies and GERD.  She complains that she feels PND when she is laying down.  She is not currently taking any OTC medications.   ROS  General: (-) fever, (-) chills, (-) night sweats, (-) weakness Skin: (-) rashes, (-) itching,. Eyes: (-) visual changes, (-) redness, (-) itching. Nose and Sinuses: (-) nasal stuffiness or itchiness, (-) postnasal drip, (-) nosebleeds, (-) sinus trouble. Mouth and Throat: (-) sore throat, (-) hoarseness. Neck: (-) swollen glands, (-) enlarged thyroid, (-) neck pain. Respiratory: + cough, (-) bloody sputum, - shortness of breath, + wheezing. Cardiovascular: - ankle swelling, (-) chest pain. Lymphatic: (-) lymph node enlargement. Neurologic: (-) numbness, (-) tingling. Psychiatric: (-) anxiety, (-) depression   Current Medication: Outpatient Encounter Medications as of 06/20/2018  Medication Sig  . oxyCODONE-acetaminophen (PERCOCET/ROXICET) 5-325 MG tablet Take by mouth.  . bisoprolol-hydrochlorothiazide (ZIAC) 5-6.25 MG per tablet Take 1 tablet by mouth at bedtime.  . methocarbamol (ROBAXIN) 500 MG tablet Take 1 tablet (500 mg total) by mouth 4 (four) times daily. (Patient not taking: Reported on 06/20/2018)  . oxyCODONE-acetaminophen (PERCOCET) 10-325 MG per tablet Take 1-2 tablets by mouth every 6 (six) hours as needed for pain. MAXIMUM TOTAL ACETAMINOPHEN DOSE IS 4000 MG PER DAY (Patient not taking: Reported on 06/20/2018)  . promethazine (PHENERGAN) 25 MG tablet Take 1 tablet (25 mg total) by mouth every 6 (six) hours as needed for nausea or vomiting.  . [DISCONTINUED]  Aspirin-Salicylamide-Caffeine (BC HEADACHE POWDER PO) Take 1 packet by mouth 2 (two) times daily as needed (migraines).  . [DISCONTINUED] medroxyPROGESTERone (DEPO-PROVERA) 150 MG/ML injection Inject 150 mg into the muscle every 3 (three) months. Last injection 09/08/13  . [DISCONTINUED] rivaroxaban (XARELTO) 10 MG TABS tablet Take 1 tablet (10 mg total) by mouth daily. (Patient not taking: Reported on 06/20/2018)  . [DISCONTINUED] sennosides-docusate sodium (SENOKOT-S) 8.6-50 MG tablet Take 2 tablets by mouth daily. (Patient not taking: Reported on 06/20/2018)   No facility-administered encounter medications on file as of 06/20/2018.     Surgical History: Past Surgical History:  Procedure Laterality Date  . HIP ARTHROSCOPY  1987   lt   . HIP SURGERY  2004   total hip-left  . ORIF TIBIA PLATEAU Left 10/02/2013   Procedure: LEFT OPEN REDUCTION INTERNAL FIXATION (ORIF) TIBIAL PLATEAU;  Surgeon: Eulas Post, MD;  Location: [facility];  Service: Orthopedics;  Laterality: Left;    Medical History: Past Medical History:  Diagnosis Date  . Arthritis   . Hypertension   . Tibial plateau fracture, left 10/02/2013    Family History: Family History  Problem Relation Age of Onset  . Diabetes Mother   . Hypertension Mother   . Brain cancer Mother     Social History: Social History   Socioeconomic History  . Marital status: Single    Spouse name: Not on file  . Number of children: Not on file  . Years of education: Not on file  . Highest education level: Not on file  Occupational History  . Not on file  Social Needs  . Financial resource strain: Not on file  .  Food insecurity:    Worry: Not on file    Inability: Not on file  . Transportation needs:    Medical: Not on file    Non-medical: Not on file  Tobacco Use  . Smoking status: Never Smoker  . Smokeless tobacco: Never Used  Substance and Sexual Activity  . Alcohol use: Yes    Comment: social  . Drug use: No   . Sexual activity: Not on file  Lifestyle  . Physical activity:    Days per week: Not on file    Minutes per session: Not on file  . Stress: Not on file  Relationships  . Social connections:    Talks on phone: Not on file    Gets together: Not on file    Attends religious service: Not on file    Active member of club or organization: Not on file    Attends meetings of clubs or organizations: Not on file    Relationship status: Not on file  . Intimate partner violence:    Fear of current or ex partner: Not on file    Emotionally abused: Not on file    Physically abused: Not on file    Forced sexual activity: Not on file  Other Topics Concern  . Not on file  Social History Narrative  . Not on file    Vital Signs: Blood pressure (!) 142/88, pulse 82, resp. rate 16, height  (1.702 m), weight 151 lb (68.5 kg), SpO2 99 %.  Examination: General Appearance: The patient is well-developed, well-nourished, and in no distress. Skin: Gross inspection of skin unremarkable. Head: normocephalic, no gross deformities. Eyes: no gross deformities noted. ENT: ears appear grossly normal no exudates. Neck: Supple. No thyromegaly. No LAD. Respiratory: mild expiratory wheez. Cardiovascular: Normal S1 and S2 without murmur or rub. Extremities: No cyanosis. pulses are equal. Neurologic: Alert and oriented. No involuntary movements.  LABS: No results found for this or any previous visit (from the past 2160 hour(s)).  Radiology: Dg Tibia/fibula Left  Result Date: 10/02/2013 CLINICAL DATA:  ORIF tibia EXAM: LEFT TIBIA AND FIBULA - 2 VIEW; DG C-ARM 61-120 MIN COMPARISON:  CT 09/13/2013 FINDINGS: 4 fluoroscopic spot images document Interval lateral plate and screw fixation of the tibial plateau fracture, fragments projecting in near anatomic alignment. IMPRESSION: 1. Internal fixation of tibial plateau fracture Electronically Signed   By: Oley Balm M.D.   On: 10/02/2013 15:48   Dg C-arm  1-60 Min  Result Date: 10/02/2013 CLINICAL DATA:  ORIF tibia EXAM: LEFT TIBIA AND FIBULA - 2 VIEW; DG C-ARM 61-120 MIN COMPARISON:  CT 09/13/2013 FINDINGS: 4 fluoroscopic spot images document Interval lateral plate and screw fixation of the tibial plateau fracture, fragments projecting in near anatomic alignment. IMPRESSION: 1. Internal fixation of tibial plateau fracture Electronically Signed   By: Oley Balm M.D.   On: 10/02/2013 15:48    No results found.  No results found.    Assessment and Plan: Patient Active Problem List   Diagnosis Date Noted  . Tibial plateau fracture, left 10/02/2013   1. Moderate persistent asthma, unspecified whether complicated Patient's albuterol inhaler refilled at this time.  We will schedule her for pulmonary function test in the coming weeks.  Also going to add Brio Ellipta to patient's regimen for a combined inhaled corticosteroid with long-acting beta-2 agonist.  Will follow in a few weeks to see if this has helped her cough. - fluticasone furoate-vilanterol (BREO ELLIPTA) 200-25 MCG/INH AEPB; Inhale 1 puff  into the lungs daily.  Dispense: 60 each; Refill: 2 - albuterol (PROVENTIL HFA;VENTOLIN HFA) 108 (90 Base) MCG/ACT inhaler; Inhale 2 puffs into the lungs every 6 (six) hours as needed for wheezing or shortness of breath.  Dispense: 1 Inhaler; Refill: 0 - Pulmonary Function Test; Future  2. Cough Will get chest ray as patient has not had 1 in quite some time. - DG Chest 2 View; Future  3. Asthma due to seasonal allergies We will provide patient prescription for Singulair for allergies.  We discussed her use medication daily. - montelukast (SINGULAIR) 10 MG tablet; Take 1 tablet (10 mg total) by mouth at bedtime.  Dispense: 90 tablet; Refill: 1  4. Impacted cerumen of right ear Debrox drops sent to pharmacy.  Instructed patient on how to use. - carbamide peroxide (DEBROX) 6.5 % OTIC solution; Place 5 drops into the right ear 2 (two) times  daily.  Dispense: 15 mL; Refill: 0   General Counseling: I have discussed the findings of the evaluation and examination with Janice.  I have also discussed any further diagnostic evaluation thatmay be needed or ordered today. Sherah verbalizes understanding of the findings of todays visit. We also reviewed her medications today and discussed drug interactions and side effects including but not limited excessive drowsiness and altered mental states. We also discussed that there is always a risk not just to her but also people around her. she has been encouraged to call the office with any questions or concerns that should arise related to todays visit.    Time spent: 35 This patient was seen by Blima Ledger AGNP-C in Collaboration with Dr. Freda Munro as a part of collaborative care agreement.   I have personally obtained a history, examined the patient, evaluated laboratory and imaging results, formulated the assessment and plan and placed orders.    Yevonne Pax, MD [facility] Pulmonary and Critical Care Sleep medicine

## 2018-06-20 NOTE — Patient Instructions (Signed)
Asthma, Adult    Asthma is a long-term (chronic) condition in which the airways get tight and narrow. The airways are the breathing passages that lead from the nose and mouth down into the lungs. A person with asthma will have times when symptoms get worse. These are called asthma attacks. They can cause coughing, whistling sounds when you breathe (wheezing), shortness of breath, and chest pain. They can make it hard to breathe. There is no cure for asthma, but medicines and lifestyle changes can help control it.  There are many things that can bring on an asthma attack or make asthma symptoms worse (triggers). Common triggers include:  · Mold.  · Dust.  · Cigarette smoke.  · Cockroaches.  · Things that can cause allergy symptoms (allergens). These include animal skin flakes (dander) and pollen from trees or grass.  · Things that pollute the air. These may include household cleaners, wood smoke, smog, or chemical odors.  · Cold air, weather changes, and wind.  · Crying or laughing hard.  · Stress.  · Certain medicines or drugs.  · Certain foods such as dried fruit, potato chips, and grape juice.  · Infections, such as a cold or the flu.  · Certain medical conditions or diseases.  · Exercise or tiring activities.  Asthma may be treated with medicines and by staying away from the things that cause asthma attacks. Types of medicines may include:  · Controller medicines. These help prevent asthma symptoms. They are usually taken every day.  · Fast-acting reliever or rescue medicines. These quickly relieve asthma symptoms. They are used as needed and provide short-term relief.  · Allergy medicines if your attacks are brought on by allergens.  · Medicines to help control the body's defense (immune) system.  Follow these instructions at home:  Avoiding triggers in your home  · Change your heating and air conditioning filter often.  · Limit your use of fireplaces and wood stoves.  · Get rid of pests (such as roaches and  mice) and their droppings.  · Throw away plants if you see mold on them.  · Clean your floors. Dust regularly. Use cleaning products that do not smell.  · Have someone vacuum when you are not home. Use a vacuum cleaner with a HEPA filter if possible.  · Replace carpet with wood, tile, or vinyl flooring. Carpet can trap animal skin flakes and dust.  · Use allergy-proof pillows, mattress covers, and box spring covers.  · Wash bed sheets and blankets every week in hot water. Dry them in a dryer.  · Keep your bedroom free of any triggers.   · Avoid pets and keep windows closed when things that cause allergy symptoms are in the air.  · Use blankets that are made of polyester or cotton.  · Clean bathrooms and kitchens with bleach. If possible, have someone repaint the walls in these rooms with mold-resistant paint. Keep out of the rooms that are being cleaned and painted.  · Wash your hands often with soap and water. If soap and water are not available, use hand sanitizer.  · Do not allow anyone to smoke in your home.  General instructions  · Take over-the-counter and prescription medicines only as told by your doctor.  ? Talk with your doctor if you have questions about how or when to take your medicines.  ? Make note if you need to use your medicines more often than usual.  · Do not use any products that   contain nicotine or tobacco, such as cigarettes and e-cigarettes. If you need help quitting, ask your doctor.  · Stay away from secondhand smoke.  · Avoid doing things outdoors when allergen counts are high and when air quality is low.  · Wear a ski mask when doing outdoor activities in the winter. The mask should cover your nose and mouth. Exercise indoors on cold days if you can.  · Warm up before you exercise. Take time to cool down after exercise.  · Use a peak flow meter as told by your doctor. A peak flow meter is a tool that measures how well the lungs are working.  · Keep track of the peak flow meter's readings.  Write them down.  · Follow your asthma action plan. This is a written plan for taking care of your asthma and treating your attacks.  · Make sure you get all the shots (vaccines) that your doctor recommends. Ask your doctor about a flu shot and a pneumonia shot.  · Keep all follow-up visits as told by your doctor. This is important.  Contact a doctor if:  · You have wheezing, shortness of breath, or a cough even while taking medicine to prevent attacks.  · The mucus you cough up (sputum) is thicker than usual.  · The mucus you cough up changes from clear or white to yellow, green, gray, or bloody.  · You have problems from the medicine you are taking, such as:  ? A rash.  ? Itching.  ? Swelling.  ? Trouble breathing.  · You need reliever medicines more than 2-3 times a week.  · Your peak flow reading is still at 50-79% of your personal best after following the action plan for 1 hour.  · You have a fever.  Get help right away if:  · You seem to be worse and are not responding to medicine during an asthma attack.  · You are short of breath even at rest.  · You get short of breath when doing very little activity.  · You have trouble eating, drinking, or talking.  · You have chest pain or tightness.  · You have a fast heartbeat.  · Your lips or fingernails start to turn blue.  · You are light-headed or dizzy, or you faint.  · Your peak flow is less than 50% of your personal best.  · You feel too tired to breathe normally.  Summary  · Asthma is a long-term (chronic) condition in which the airways get tight and narrow. An asthma attack can make it hard to breathe.  · Asthma cannot be cured, but medicines and lifestyle changes can help control it.  · Make sure you understand how to avoid triggers and how and when to use your medicines.  This information is not intended to replace advice given to you by your health care provider. Make sure you discuss any questions you have with your health care provider.  Document  Released: 09/12/2007 Document Revised: 04/30/2016 Document Reviewed: 04/30/2016  Elsevier Interactive Patient Education © 2019 [employer]

## 2018-06-25 ENCOUNTER — Ambulatory Visit: Payer: Self-pay | Admitting: Internal Medicine

## 2018-07-09 ENCOUNTER — Encounter: Payer: Self-pay | Admitting: Adult Health

## 2018-07-09 ENCOUNTER — Ambulatory Visit: Payer: Self-pay | Admitting: Internal Medicine

## 2018-07-21 ENCOUNTER — Ambulatory Visit: Payer: Self-pay | Admitting: Adult Health

## 2019-05-01 IMAGING — CR CHEST - 2 VIEW
1 series · 2 of 2 positions shown · non-contrast
Comparison: PA and lateral chest 08/06/2015.

CLINICAL DATA: Chronic cough which has worsened over the past week.

EXAM:
CHEST - 2 VIEW

[Series 1: dg chest 2 view · 0.14mm/px · 2 of 2 slices shown]
[im 1/2]
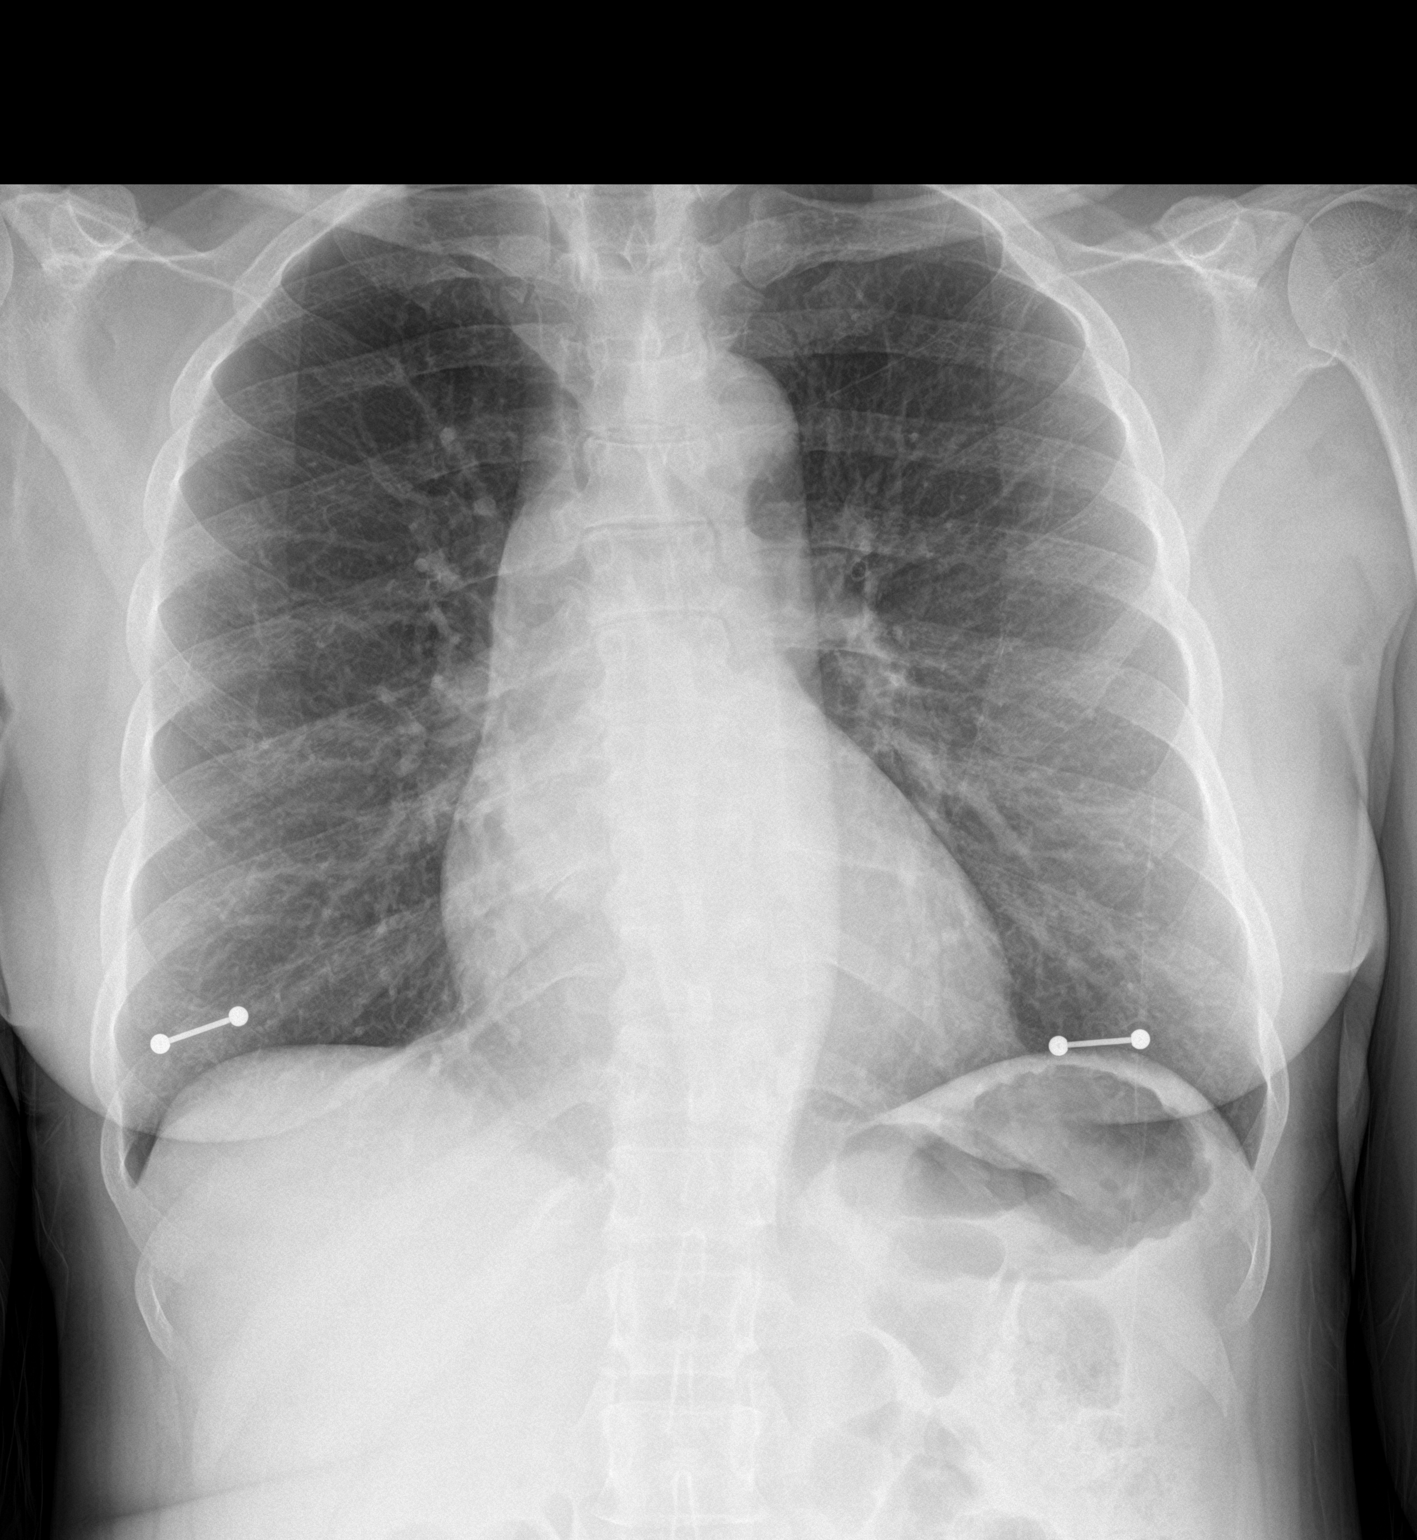
[im 2/2]
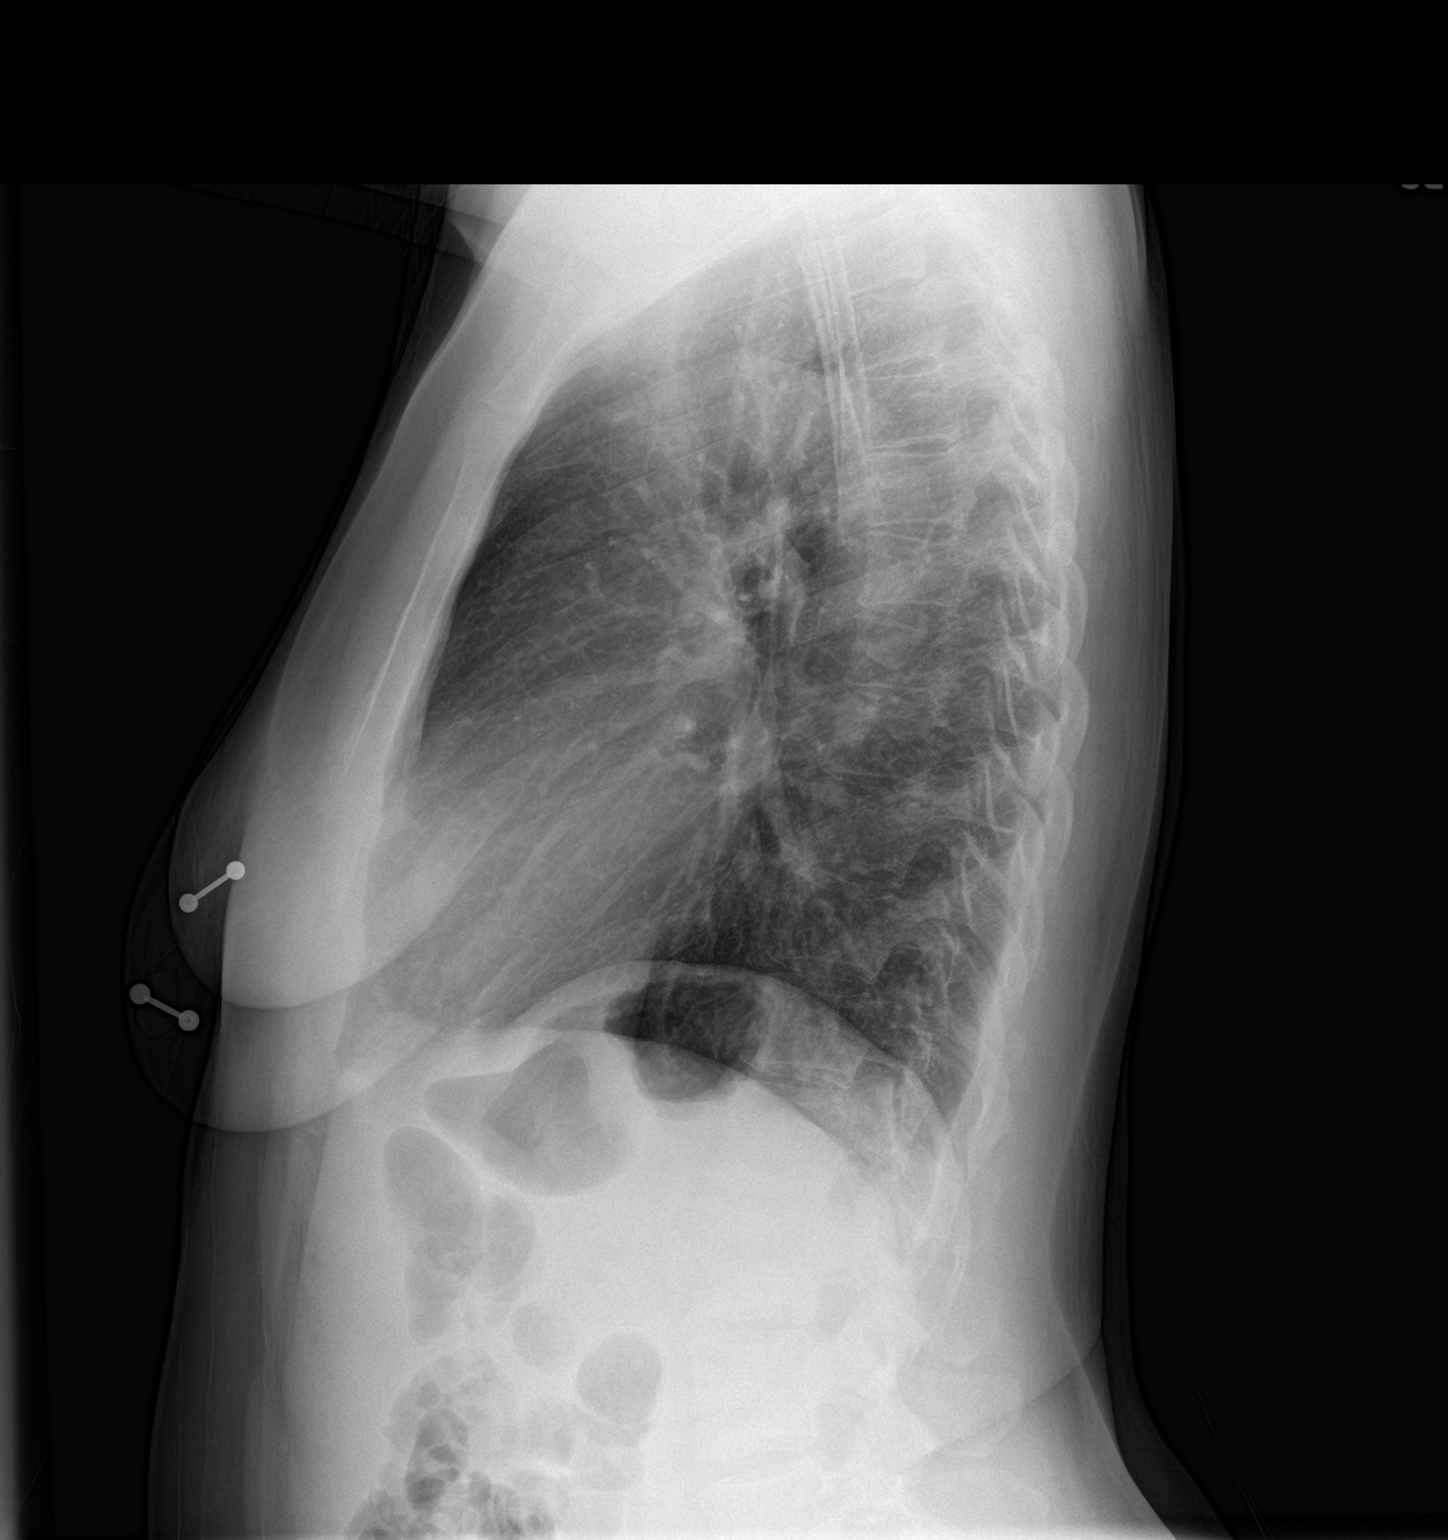

[2 of 2 positions shown; findings below may reference images not displayed]

FINDINGS: The lungs are clear. Heart size is normal. No pneumothorax or
pleural fluid. No acute or focal bony abnormality.
IMPRESSION: Negative chest.

## 2020-11-29 NOTE — Patient Instructions (Addendum)
DUE TO COVID-19 ONLY ONE VISITOR IS ALLOWED TO COME WITH YOU AND STAY IN THE WAITING ROOM ONLY DURING PRE OP AND PROCEDURE.   **NO VISITORS ARE ALLOWED IN THE SHORT STAY AREA OR RECOVERY ROOM!!**  IF YOU WILL BE ADMITTED INTO THE HOSPITAL YOU ARE ALLOWED ONLY TWO SUPPORT PEOPLE DURING VISITATION HOURS ONLY (10AM -8PM)   The support person(s) may change daily. The support person(s) must pass our screening, gel in and out, and wear a mask at all times, including in the patient's room. Patients must also wear a mask when staff or their support person are in the room.  No visitors under the age of 16. Any visitor under the age of 43 must be accompanied by an adult.    COVID SWAB TESTING MUST BE COMPLETED ON:  12/09/20 **MUST PRESENT COMPLETED FORM AT TESTING SITE**    [address] [place] (backside of the building) You are not required to quarantine, however you are required to wear a well-fitted mask when you are out and around people not in your household.  Hand Hygiene often Do NOT share personal items Notify your provider if you are in close contact with someone who has COVID or you develop fever 100.4 or greater, new onset of sneezing, cough, sore throat, shortness of breath or body aches.  [facility] En[facility] [address], [address], must go inside of the hospital, NOT A DRIVE THRU!  (Must self quarantine after testing. Follow instructions on handout.)       Your procedure is scheduled on: 12/13/20   Report to [facility] Main  Entrance    Report to admitting at : 10:05 AM   Call this number if you have problems the morning of surgery [phone]   Do not eat food :After Midnight.   May have liquids until : 9:50 AM.   day of surgery  CLEAR LIQUID DIET  Foods Allowed                                                                     Foods Excluded  Water, Black Coffee and tea, regular and decaf                              liquids that you cannot  Plain Jell-O in any flavor  (No red)                                           see through such as: Fruit ices (not with fruit pulp)                                     milk, soups, orange juice              Iced Popsicles (No red)  All solid food                                   Apple juices Sports drinks like Gatorade (No red) Lightly seasoned clear broth or consume(fat free) Sugar  Sample Menu Breakfast                                Lunch                                     Supper Cranberry juice                    Beef broth                            Chicken broth Jell-O                                     Grape juice                           Apple juice Coffee or tea                        Jell-O                                      Popsicle                                                Coffee or tea                        Coffee or tea      Complete one Ensure drink the morning of surgery at: 9:50 AM       the day of surgery.    The day of surgery:  Drink ONE (1) Pre-Surgery Clear Ensure or G2 by am the morning of surgery. Drink in one sitting. Do not sip.  This drink was given to you during your hospital  pre-op appointment visit. Nothing else to drink after completing the  Pre-Surgery Clear Ensure or G2.          If you have questions, please contact your surgeon's office.     Oral Hygiene is also important to reduce your risk of infection.                                    Remember - BRUSH YOUR TEETH THE MORNING OF SURGERY WITH YOUR REGULAR TOOTHPASTE   Do NOT smoke after Midnight   Take these medicines the morning of surgery with A SIP OF WATER: N/A. Use inhalers as usual.  You may not have any metal on your body including hair pins, jewelry, and body piercing             Do not wear make-up, lotions, powders, perfumes/cologne, or deodorant  Do not wear nail polish  including gel and S&S, artificial/acrylic nails, or any other type of covering on natural nails including finger and toenails. If you have artificial nails, gel coating, etc. that needs to be removed by a nail salon please have this removed prior to surgery or surgery may need to be canceled/ delayed if the surgeon/ anesthesia feels like they are unable to be safely monitored.   Do not shave  48 hours prior to surgery.    Do not bring valuables to the hospital. [facility] IS NOT             RESPONSIBLE   FOR VALUABLES.   Contacts, dentures or bridgework may not be worn into surgery.   Bring small overnight bag day of surgery.    Patients discharged the day of surgery will not be allowed to drive home.   Special Instructions: Bring a copy of your healthcare power of attorney and living will documents         the day of surgery if you haven't scanned them in before.              Please read over the following fact sheets you were given: IF YOU HAVE QUESTIONS ABOUT YOUR PRE OP INSTRUCTIONS PLEASE CALL [phone]   [facility] - Preparing for Surgery Before surgery, you can play an important role.  Because skin is not sterile, your skin needs to be as free of germs as possible.  You can reduce the number of germs on your skin by washing with CHG (chlorahexidine gluconate) soap before surgery.  CHG is an antiseptic cleaner which kills germs and bonds with the skin to continue killing germs even after washing. Please DO NOT use if you have an allergy to CHG or antibacterial soaps.  If your skin becomes reddened/irritated stop using the CHG and inform your nurse when you arrive at Short Stay. Do not shave (including legs and underarms) for at least 48 hours prior to the first CHG shower.  You may shave your face/neck. Please follow these instructions carefully:  1.  Shower with CHG Soap the night before surgery and the  morning of Surgery.  2.  If you choose to wash your hair, wash your hair first  as usual with your  normal  shampoo.  3.  After you shampoo, rinse your hair and body thoroughly to remove the  shampoo.                           4.  Use CHG as you would any other liquid soap.  You can apply chg directly  to the skin and wash                       Gently with a scrungie or clean washcloth.  5.  Apply the CHG Soap to your body ONLY FROM THE NECK DOWN.   Do not use on face/ open                           Wound or open sores. Avoid contact with eyes, ears mouth and genitals (private parts).  Wash face,  Genitals (private parts) with your normal soap.             6.  Wash thoroughly, paying special attention to the area where your surgery  will be performed.  7.  Thoroughly rinse your body with warm water from the neck down.  8.  DO NOT shower/wash with your normal soap after using and rinsing off  the CHG Soap.                9.  Pat yourself dry with a clean towel.            10.  Wear clean pajamas.            11.  Place clean sheets on your bed the night of your first shower and do not  sleep with pets. Day of Surgery : Do not apply any lotions/deodorants the morning of surgery.  Please wear clean clothes to the hospital/surgery center.  FAILURE TO FOLLOW THESE INSTRUCTIONS MAY RESULT IN THE CANCELLATION OF YOUR SURGERY PATIENT SIGNATURE_________________________________  NURSE SIGNATURE__________________________________  ________________________________________________________________________  Deanna Curtis  An incentive spirometer is a tool that can help keep your lungs clear and active. This tool measures how well you are filling your lungs with each breath. Taking long deep breaths may help reverse or decrease the chance of developing breathing (pulmonary) problems (especially infection) following: A long period of time when you are unable to move or be active. BEFORE THE PROCEDURE  If the spirometer includes an indicator to show your best  effort, your nurse or respiratory therapist will set it to a desired goal. If possible, sit up straight or lean slightly forward. Try not to slouch. Hold the incentive spirometer in an upright position. INSTRUCTIONS FOR USE  Sit on the edge of your bed if possible, or sit up as far as you can in bed or on a chair. Hold the incentive spirometer in an upright position. Breathe out normally. Place the mouthpiece in your mouth and seal your lips tightly around it. Breathe in slowly and as deeply as possible, raising the piston or the ball toward the top of the column. Hold your breath for 3-5 seconds or for as long as possible. Allow the piston or ball to fall to the bottom of the column. Remove the mouthpiece from your mouth and breathe out normally. Rest for a few seconds and repeat Steps 1 through 7 at least 10 times every 1-2 hours when you are awake. Take your time and take a few normal breaths between deep breaths. The spirometer may include an indicator to show your best effort. Use the indicator as a goal to work toward during each repetition. After each set of 10 deep breaths, practice coughing to be sure your lungs are clear. If you have an incision (the cut made at the time of surgery), support your incision when coughing by placing a pillow or rolled up towels firmly against it. Once you are able to get out of bed, walk around indoors and cough well. You may stop using the incentive spirometer when instructed by your caregiver.  RISKS AND COMPLICATIONS Take your time so you do not get dizzy or light-headed. If you are in pain, you may need to take or ask for pain medication before doing incentive spirometry. It is harder to take a deep breath if you are having pain. AFTER USE Rest and breathe slowly and easily. It can be helpful to keep track of a  log of your progress. Your caregiver can provide you with a simple table to help with this. If you are using the spirometer at home, follow  these instructions: SEEK MEDICAL CARE IF:  You are having difficultly using the spirometer. You have trouble using the spirometer as often as instructed. Your pain medication is not giving enough relief while using the spirometer. You develop fever of 100.5 F (38.1 C) or higher. SEEK IMMEDIATE MEDICAL CARE IF:  You cough up bloody sputum that had not been present before. You develop fever of 102 F (38.9 C) or greater. You develop worsening pain at or near the incision site. MAKE SURE YOU:  Understand these instructions. Will watch your condition. Will get help right away if you are not doing well or get worse. Document Released: 08/06/2006 Document Revised: 06/18/2011 Document Reviewed: 10/07/2006 [facility] Patient Information 2014 [place], [place].   ________________________________________________________________________

## 2020-11-30 ENCOUNTER — Encounter (HOSPITAL_COMMUNITY)
Admission: RE | Admit: 2020-11-30 | Discharge: 2020-11-30 | Disposition: A | Discharge status: Home / Self Care | Payer: Medicare HMO | Source: Ambulatory Visit | Attending: Orthopedic Surgery | Admitting: Orthopedic Surgery

## 2020-11-30 ENCOUNTER — Encounter (HOSPITAL_COMMUNITY): Payer: Self-pay

## 2020-11-30 ENCOUNTER — Other Ambulatory Visit: Payer: Self-pay

## 2020-11-30 DIAGNOSIS — Z01818 Encounter for other preprocedural examination: Secondary | ICD-10-CM | POA: Insufficient documentation

## 2020-11-30 HISTORY — DX: Cardiac murmur, unspecified: R01.1

## 2020-11-30 LAB — COMPREHENSIVE METABOLIC PANEL
ALT: 17 U/L (ref 0–44)
AST: 14 U/L — ABNORMAL LOW (ref 15–41)
Albumin: 4.3 g/dL (ref 3.5–5.0)
Alkaline Phosphatase: 71 U/L (ref 38–126)
Anion gap: 8 (ref 5–15)
BUN: 11 mg/dL (ref 6–20)
CO2: 23 mmol/L (ref 22–32)
Calcium: 10 mg/dL (ref 8.9–10.3)
Chloride: 110 mmol/L (ref 98–111)
Creatinine, Ser: 0.65 mg/dL (ref 0.44–1.00)
GFR, Estimated: >60 mL/min (ref >60)
Glucose, Bld: 94 mg/dL (ref 70–99)
Potassium: 3.4 mmol/L — ABNORMAL LOW (ref 3.5–5.1)
Sodium: 141 mmol/L (ref 135–145)
Total Bilirubin: 1.3 mg/dL — ABNORMAL HIGH (ref 0.3–1.2)
Total Protein: 7.8 g/dL (ref 6.5–8.1)

## 2020-11-30 LAB — TYPE AND SCREEN
ABO/RH(D): B POS
Antibody Screen: NEGATIVE

## 2020-11-30 LAB — CBC
HCT: 37.2 % (ref 36.0–46.0)
Hemoglobin: 11.9 g/dL — ABNORMAL LOW (ref 12.0–15.0)
MCH: 29.7 pg (ref 26.0–34.0)
MCHC: 32 g/dL (ref 30.0–36.0)
MCV: 92.8 fL (ref 80.0–100.0)
Platelets: 257 10*3/uL (ref 150–400)
RBC: 4.01 MIL/uL (ref 3.87–5.11)
RDW: 12.5 % (ref 11.5–15.5)
WBC: 7.2 10*3/uL (ref 4.0–10.5)
nRBC: 0 % (ref 0.0–0.2)

## 2020-11-30 LAB — SURGICAL PCR SCREEN
MRSA, PCR: NEGATIVE
Staphylococcus aureus: NEGATIVE

## 2020-11-30 NOTE — Progress Notes (Addendum)
COVID Vaccine Completed: Yes Date COVID Vaccine completed: 2021. X 2 COVID vaccine manufacturer: Pfizer     Covid test: 12/09/20 PCP - [employer]: [facility] [place] Cardiologist - NO  Chest x-ray -  EKG -  Stress Test -  ECHO -  Cardiac Cath -  Pacemaker/ICD device last checked:  Sleep Study - Yes CPAP - NO  Fasting Blood Sugar -  Checks Blood Sugar _____ times a day  Blood Thinner Instructions: Aspirin Instructions: Last Dose:  Anesthesia review: Hx: heart murmur,HTN  Patient denies shortness of breath, fever, cough and chest pain at PAT appointment   Patient verbalized understanding of instructions that were given to them at the PAT appointment. Patient was also instructed that they will need to review over the PAT instructions again at home before surgery.

## 2020-12-09 ENCOUNTER — Other Ambulatory Visit: Payer: Self-pay | Admitting: Orthopedic Surgery

## 2020-12-09 LAB — SARS CORONAVIRUS 2 (TAT 6-24 HRS): SARS Coronavirus 2: NEGATIVE

## 2020-12-13 ENCOUNTER — Observation Stay (HOSPITAL_COMMUNITY)
Admission: RE | Admit: 2020-12-13 | Discharge: 2020-12-14 | Disposition: A | Discharge status: Home / Self Care | Payer: Medicare HMO | Source: Ambulatory Visit | Attending: Orthopedic Surgery | Admitting: Orthopedic Surgery

## 2020-12-13 ENCOUNTER — Encounter (HOSPITAL_COMMUNITY): Payer: Self-pay | Admitting: Orthopedic Surgery

## 2020-12-13 ENCOUNTER — Encounter (HOSPITAL_COMMUNITY)
Admission: RE | Disposition: A | Discharge status: Home / Self Care | Payer: Self-pay | Source: Ambulatory Visit | Attending: Orthopedic Surgery

## 2020-12-13 ENCOUNTER — Ambulatory Visit (HOSPITAL_COMMUNITY): Payer: Medicare HMO | Admitting: Certified Registered"

## 2020-12-13 ENCOUNTER — Ambulatory Visit (HOSPITAL_COMMUNITY): Payer: Medicare HMO | Admitting: Physician Assistant

## 2020-12-13 ENCOUNTER — Observation Stay (HOSPITAL_COMMUNITY): Payer: Medicare HMO

## 2020-12-13 DIAGNOSIS — M1611 Unilateral primary osteoarthritis, right hip: Secondary | ICD-10-CM | POA: Diagnosis present

## 2020-12-13 DIAGNOSIS — I10 Essential (primary) hypertension: Secondary | ICD-10-CM | POA: Insufficient documentation

## 2020-12-13 DIAGNOSIS — Z79899 Other long term (current) drug therapy: Secondary | ICD-10-CM | POA: Diagnosis not present

## 2020-12-13 DIAGNOSIS — S72041A Displaced fracture of base of neck of right femur, initial encounter for closed fracture: Secondary | ICD-10-CM

## 2020-12-13 DIAGNOSIS — Z96649 Presence of unspecified artificial hip joint: Secondary | ICD-10-CM

## 2020-12-13 HISTORY — PX: TOTAL HIP ARTHROPLASTY: SHX124

## 2020-12-13 LAB — PREGNANCY, URINE: Preg Test, Ur: NEGATIVE

## 2020-12-13 SURGERY — ARTHROPLASTY, HIP, TOTAL, ANTERIOR APPROACH
Anesthesia: General | Site: Hip | Laterality: Right

## 2020-12-13 MED ORDER — LACTATED RINGERS IV SOLN: INTRAVENOUS | Status: DC

## 2020-12-13 MED ORDER — METOCLOPRAMIDE HCL 5 MG PO TABS: 5.0000 mg | ORAL_TABLET | Freq: Three times a day (TID) | ORAL | Status: DC | PRN

## 2020-12-13 MED ORDER — FERROUS SULFATE 325 (65 FE) MG PO TABS
325.0000 mg | ORAL_TABLET | Freq: Three times a day (TID) | ORAL | Status: DC
  Administered 2020-12-14: 325 mg via ORAL
  Filled 2020-12-13: qty 1

## 2020-12-13 MED ORDER — MIDAZOLAM HCL 2 MG/2ML IJ SOLN
INTRAMUSCULAR | Status: AC
  Filled 2020-12-13: qty 2

## 2020-12-13 MED ORDER — ONDANSETRON HCL 4 MG/2ML IJ SOLN
INTRAMUSCULAR | Status: AC
  Filled 2020-12-13: qty 2

## 2020-12-13 MED ORDER — TRANEXAMIC ACID-NACL 1000-0.7 MG/100ML-% IV SOLN
1000.0000 mg | INTRAVENOUS | Status: AC
  Administered 2020-12-13: 1000 mg via INTRAVENOUS
  Filled 2020-12-13: qty 100

## 2020-12-13 MED ORDER — TRANEXAMIC ACID-NACL 1000-0.7 MG/100ML-% IV SOLN
1000.0000 mg | Freq: Once | INTRAVENOUS | Status: AC
  Administered 2020-12-13: 1000 mg via INTRAVENOUS
  Filled 2020-12-13: qty 100

## 2020-12-13 MED ORDER — SUGAMMADEX SODIUM 200 MG/2ML IV SOLN
INTRAVENOUS | Status: DC | PRN
  Administered 2020-12-13: 200 mg via INTRAVENOUS

## 2020-12-13 MED ORDER — SODIUM CHLORIDE 0.9 % IV SOLN: INTRAVENOUS | Status: DC

## 2020-12-13 MED ORDER — METHOCARBAMOL 500 MG PO TABS
500.0000 mg | ORAL_TABLET | Freq: Four times a day (QID) | ORAL | Status: DC | PRN
  Administered 2020-12-14: 500 mg via ORAL
  Filled 2020-12-13: qty 1

## 2020-12-13 MED ORDER — ACETAMINOPHEN 10 MG/ML IV SOLN: 1000.0000 mg | Freq: Once | INTRAVENOUS | Status: DC | PRN

## 2020-12-13 MED ORDER — FENTANYL CITRATE (PF) 250 MCG/5ML IJ SOLN
INTRAMUSCULAR | Status: DC | PRN
  Administered 2020-12-13: 100 ug via INTRAVENOUS

## 2020-12-13 MED ORDER — LIDOCAINE 2% (20 MG/ML) 5 ML SYRINGE
INTRAMUSCULAR | Status: AC
  Filled 2020-12-13: qty 5

## 2020-12-13 MED ORDER — HYDROMORPHONE HCL 1 MG/ML IJ SOLN
INTRAMUSCULAR | Status: AC
  Filled 2020-12-13: qty 1

## 2020-12-13 MED ORDER — PHENOL 1.4 % MT LIQD: 1.0000 | OROMUCOSAL | Status: DC | PRN

## 2020-12-13 MED ORDER — POLYETHYLENE GLYCOL 3350 17 G PO PACK: 17.0000 g | PACK | Freq: Every day | ORAL | Status: DC | PRN

## 2020-12-13 MED ORDER — CEFAZOLIN SODIUM-DEXTROSE 2-4 GM/100ML-% IV SOLN
2.0000 g | Freq: Four times a day (QID) | INTRAVENOUS | Status: AC
  Administered 2020-12-13 – 2020-12-14 (×2): 2 g via INTRAVENOUS
  Filled 2020-12-13 (×2): qty 100

## 2020-12-13 MED ORDER — HYDROMORPHONE HCL 2 MG/ML IJ SOLN
INTRAMUSCULAR | Status: AC
  Filled 2020-12-13: qty 1

## 2020-12-13 MED ORDER — ONDANSETRON HCL 4 MG/2ML IJ SOLN
INTRAMUSCULAR | Status: DC | PRN
  Administered 2020-12-13: 4 mg via INTRAVENOUS

## 2020-12-13 MED ORDER — ACETAMINOPHEN 160 MG/5ML PO SOLN: 325.0000 mg | Freq: Once | ORAL | Status: DC | PRN

## 2020-12-13 MED ORDER — MIDAZOLAM HCL 5 MG/5ML IJ SOLN
INTRAMUSCULAR | Status: DC | PRN
  Administered 2020-12-13: 2 mg via INTRAVENOUS

## 2020-12-13 MED ORDER — ROCURONIUM BROMIDE 10 MG/ML (PF) SYRINGE
PREFILLED_SYRINGE | INTRAVENOUS | Status: DC | PRN
  Administered 2020-12-13: 60 mg via INTRAVENOUS

## 2020-12-13 MED ORDER — ACETAMINOPHEN 325 MG PO TABS: 325.0000 mg | ORAL_TABLET | Freq: Four times a day (QID) | ORAL | Status: DC | PRN

## 2020-12-13 MED ORDER — PROMETHAZINE HCL 25 MG/ML IJ SOLN: 6.2500 mg | INTRAMUSCULAR | Status: DC | PRN

## 2020-12-13 MED ORDER — AMISULPRIDE (ANTIEMETIC) 5 MG/2ML IV SOLN: 10.0000 mg | Freq: Once | INTRAVENOUS | Status: DC | PRN

## 2020-12-13 MED ORDER — HYDROMORPHONE HCL 1 MG/ML IJ SOLN
INTRAMUSCULAR | Status: DC | PRN
  Administered 2020-12-13: .5 mg via INTRAVENOUS
  Administered 2020-12-13: 1 mg via INTRAVENOUS
  Administered 2020-12-13: .5 mg via INTRAVENOUS

## 2020-12-13 MED ORDER — MENTHOL 3 MG MT LOZG: 1.0000 | LOZENGE | OROMUCOSAL | Status: DC | PRN

## 2020-12-13 MED ORDER — PROPOFOL 500 MG/50ML IV EMUL
INTRAVENOUS | Status: DC | PRN
  Administered 2020-12-13: 175 ug/kg/min via INTRAVENOUS

## 2020-12-13 MED ORDER — ACETAMINOPHEN 325 MG PO TABS: 325.0000 mg | ORAL_TABLET | Freq: Once | ORAL | Status: DC | PRN

## 2020-12-13 MED ORDER — ONDANSETRON HCL 4 MG/2ML IJ SOLN
4.0000 mg | Freq: Four times a day (QID) | INTRAMUSCULAR | Status: DC | PRN
  Administered 2020-12-13 – 2020-12-14 (×2): 4 mg via INTRAVENOUS
  Filled 2020-12-13 (×2): qty 2

## 2020-12-13 MED ORDER — PROPOFOL 1000 MG/100ML IV EMUL
INTRAVENOUS | Status: AC
  Filled 2020-12-13: qty 100

## 2020-12-13 MED ORDER — OXYCODONE HCL 5 MG PO TABS
10.0000 mg | ORAL_TABLET | ORAL | Status: DC | PRN
  Filled 2020-12-13: qty 2

## 2020-12-13 MED ORDER — BISACODYL 10 MG RE SUPP: 10.0000 mg | Freq: Every day | RECTAL | Status: DC | PRN

## 2020-12-13 MED ORDER — DEXAMETHASONE SODIUM PHOSPHATE 10 MG/ML IJ SOLN
INTRAMUSCULAR | Status: AC
  Filled 2020-12-13: qty 1

## 2020-12-13 MED ORDER — AMLODIPINE BESYLATE 10 MG PO TABS: 10.0000 mg | ORAL_TABLET | Freq: Every day | ORAL | Status: DC

## 2020-12-13 MED ORDER — POVIDONE-IODINE 10 % EX SWAB
2.0000 "application " | Freq: Once | CUTANEOUS | Status: AC
  Administered 2020-12-13: 2 via TOPICAL

## 2020-12-13 MED ORDER — MONTELUKAST SODIUM 10 MG PO TABS
10.0000 mg | ORAL_TABLET | Freq: Every day | ORAL | Status: DC
  Administered 2020-12-13: 10 mg via ORAL
  Filled 2020-12-13: qty 1

## 2020-12-13 MED ORDER — DOCUSATE SODIUM 100 MG PO CAPS
100.0000 mg | ORAL_CAPSULE | Freq: Two times a day (BID) | ORAL | Status: DC
  Administered 2020-12-13 – 2020-12-14 (×2): 100 mg via ORAL
  Filled 2020-12-13 (×2): qty 1

## 2020-12-13 MED ORDER — STERILE WATER FOR IRRIGATION IR SOLN
Status: DC | PRN
  Administered 2020-12-13: 2000 mL

## 2020-12-13 MED ORDER — CHLORHEXIDINE GLUCONATE 0.12 % MT SOLN
15.0000 mL | Freq: Once | OROMUCOSAL | Status: AC
  Administered 2020-12-13: 15 mL via OROMUCOSAL

## 2020-12-13 MED ORDER — METOCLOPRAMIDE HCL 5 MG/ML IJ SOLN
5.0000 mg | Freq: Three times a day (TID) | INTRAMUSCULAR | Status: DC | PRN
  Administered 2020-12-14: 10 mg via INTRAVENOUS
  Filled 2020-12-13: qty 2

## 2020-12-13 MED ORDER — 0.9 % SODIUM CHLORIDE (POUR BTL) OPTIME
TOPICAL | Status: DC | PRN
  Administered 2020-12-13: 1000 mL

## 2020-12-13 MED ORDER — DEXAMETHASONE SODIUM PHOSPHATE 10 MG/ML IJ SOLN
10.0000 mg | Freq: Once | INTRAMUSCULAR | Status: AC
  Administered 2020-12-14: 10 mg via INTRAVENOUS
  Filled 2020-12-13: qty 1

## 2020-12-13 MED ORDER — METHOCARBAMOL 500 MG IVPB - SIMPLE MED
500.0000 mg | Freq: Four times a day (QID) | INTRAVENOUS | Status: DC | PRN
  Filled 2020-12-13: qty 50

## 2020-12-13 MED ORDER — ORAL CARE MOUTH RINSE: 15.0000 mL | Freq: Once | OROMUCOSAL | Status: AC

## 2020-12-13 MED ORDER — METHOCARBAMOL 500 MG IVPB - SIMPLE MED
INTRAVENOUS | Status: AC
  Administered 2020-12-13: 500 mg via INTRAVENOUS
  Filled 2020-12-13: qty 50

## 2020-12-13 MED ORDER — PANTOPRAZOLE SODIUM 40 MG PO TBEC
40.0000 mg | DELAYED_RELEASE_TABLET | Freq: Every day | ORAL | Status: DC
  Administered 2020-12-14: 40 mg via ORAL
  Filled 2020-12-13: qty 1

## 2020-12-13 MED ORDER — ASPIRIN 81 MG PO CHEW
81.0000 mg | CHEWABLE_TABLET | Freq: Two times a day (BID) | ORAL | Status: DC
  Administered 2020-12-13 – 2020-12-14 (×2): 81 mg via ORAL
  Filled 2020-12-13 (×2): qty 1

## 2020-12-13 MED ORDER — PROPOFOL 10 MG/ML IV BOLUS
INTRAVENOUS | Status: DC | PRN
  Administered 2020-12-13: 160 mg via INTRAVENOUS

## 2020-12-13 MED ORDER — LIDOCAINE 2% (20 MG/ML) 5 ML SYRINGE
INTRAMUSCULAR | Status: DC | PRN
  Administered 2020-12-13: 80 mg via INTRAVENOUS

## 2020-12-13 MED ORDER — MEPERIDINE HCL 50 MG/ML IJ SOLN: 6.2500 mg | INTRAMUSCULAR | Status: DC | PRN

## 2020-12-13 MED ORDER — ACETAMINOPHEN 10 MG/ML IV SOLN
INTRAVENOUS | Status: DC | PRN
  Administered 2020-12-13: 1000 mg via INTRAVENOUS

## 2020-12-13 MED ORDER — DIPHENHYDRAMINE HCL 12.5 MG/5ML PO ELIX: 12.5000 mg | ORAL_SOLUTION | ORAL | Status: DC | PRN

## 2020-12-13 MED ORDER — ALBUTEROL SULFATE (2.5 MG/3ML) 0.083% IN NEBU: 2.5000 mg | INHALATION_SOLUTION | Freq: Four times a day (QID) | RESPIRATORY_TRACT | Status: DC | PRN

## 2020-12-13 MED ORDER — DEXAMETHASONE SODIUM PHOSPHATE 10 MG/ML IJ SOLN
8.0000 mg | Freq: Once | INTRAMUSCULAR | Status: AC
  Administered 2020-12-13: 8 mg via INTRAVENOUS

## 2020-12-13 MED ORDER — FENTANYL CITRATE (PF) 100 MCG/2ML IJ SOLN
INTRAMUSCULAR | Status: AC
  Filled 2020-12-13: qty 2

## 2020-12-13 MED ORDER — ACETAMINOPHEN 10 MG/ML IV SOLN
INTRAVENOUS | Status: AC
  Filled 2020-12-13: qty 100

## 2020-12-13 MED ORDER — ONDANSETRON HCL 4 MG PO TABS
4.0000 mg | ORAL_TABLET | Freq: Four times a day (QID) | ORAL | Status: DC | PRN
  Administered 2020-12-14: 4 mg via ORAL
  Filled 2020-12-13 (×2): qty 1

## 2020-12-13 MED ORDER — FLUTICASONE PROPIONATE 50 MCG/ACT NA SUSP: 2.0000 | Freq: Every day | NASAL | Status: DC | PRN

## 2020-12-13 MED ORDER — HYDROMORPHONE HCL 1 MG/ML IJ SOLN
0.2500 mg | INTRAMUSCULAR | Status: DC | PRN
  Administered 2020-12-13 (×2): 0.5 mg via INTRAVENOUS

## 2020-12-13 MED ORDER — CEFAZOLIN SODIUM-DEXTROSE 2-4 GM/100ML-% IV SOLN
2.0000 g | INTRAVENOUS | Status: AC
  Administered 2020-12-13: 2 g via INTRAVENOUS
  Filled 2020-12-13: qty 100

## 2020-12-13 MED ORDER — CELECOXIB 200 MG PO CAPS
200.0000 mg | ORAL_CAPSULE | Freq: Two times a day (BID) | ORAL | Status: DC
  Administered 2020-12-13 – 2020-12-14 (×2): 200 mg via ORAL
  Filled 2020-12-13 (×2): qty 1

## 2020-12-13 MED ORDER — HYDROMORPHONE HCL 1 MG/ML IJ SOLN: 0.5000 mg | INTRAMUSCULAR | Status: DC | PRN

## 2020-12-13 MED ORDER — ROCURONIUM BROMIDE 10 MG/ML (PF) SYRINGE
PREFILLED_SYRINGE | INTRAVENOUS | Status: AC
  Filled 2020-12-13: qty 10

## 2020-12-13 MED ORDER — OXYCODONE HCL 5 MG PO TABS
5.0000 mg | ORAL_TABLET | ORAL | Status: DC | PRN
  Administered 2020-12-13 – 2020-12-14 (×3): 10 mg via ORAL
  Filled 2020-12-13 (×2): qty 2

## 2020-12-13 SURGICAL SUPPLY — 42 items
ADH SKN CLS APL DERMABOND .7 (GAUZE/BANDAGES/DRESSINGS) ×1
BAG COUNTER SPONGE SURGICOUNT (BAG) IMPLANT
BAG DECANTER FOR FLEXI CONT (MISCELLANEOUS) IMPLANT
BAG SPEC THK2 15X12 ZIP CLS (MISCELLANEOUS)
BAG SPNG CNTER NS LX DISP (BAG)
BAG ZIPLOCK 12X15 (MISCELLANEOUS) IMPLANT
BLADE SAG 18X100X1.27 (BLADE) ×2 IMPLANT
COVER PERINEAL POST (MISCELLANEOUS) ×2 IMPLANT
COVER SURGICAL LIGHT HANDLE (MISCELLANEOUS) ×2 IMPLANT
CUP ACETBLR 52 OD PINNACLE (Hips) ×1 IMPLANT
DERMABOND ADVANCED (GAUZE/BANDAGES/DRESSINGS) ×1
DERMABOND ADVANCED .7 DNX12 (GAUZE/BANDAGES/DRESSINGS) ×1 IMPLANT
DRAPE FOOT SWITCH (DRAPES) ×2 IMPLANT
DRAPE STERI IOBAN 125X83 (DRAPES) ×2 IMPLANT
DRAPE U-SHAPE 47X51 STRL (DRAPES) ×4 IMPLANT
DRESSING AQUACEL AG SP 3.5X10 (GAUZE/BANDAGES/DRESSINGS) ×1 IMPLANT
DRSG AQUACEL AG SP 3.5X10 (GAUZE/BANDAGES/DRESSINGS) ×2
DURAPREP 26ML APPLICATOR (WOUND CARE) ×2 IMPLANT
ELECT REM PT RETURN 15FT ADLT (MISCELLANEOUS) ×2 IMPLANT
ELIMINATOR HOLE APEX DEPUY (Hips) ×1 IMPLANT
GLOVE SURG ENC MOIS LTX SZ6 (GLOVE) ×4 IMPLANT
GLOVE SURG UNDER LTX SZ7.5 (GLOVE) ×2 IMPLANT
GLOVE SURG UNDER POLY LF SZ6.5 (GLOVE) ×2 IMPLANT
GLOVE SURG UNDER POLY LF SZ7.5 (GLOVE) ×4 IMPLANT
GOWN STRL REUS W/TWL LRG LVL3 (GOWN DISPOSABLE) ×4 IMPLANT
HEAD CERAMIC 36 PLUS5 (Hips) ×1 IMPLANT
HOLDER FOLEY CATH W/STRAP (MISCELLANEOUS) ×2 IMPLANT
KIT TURNOVER KIT A (KITS) ×2 IMPLANT
LINER NEUTRAL 52X36MM PLUS 4 (Liner) ×1 IMPLANT
PACK ANTERIOR HIP CUSTOM (KITS) ×2 IMPLANT
PENCIL SMOKE EVACUATOR (MISCELLANEOUS) IMPLANT
SCREW 6.5MMX30MM (Screw) ×1 IMPLANT
STEM FEMORAL SZ6 HIGH ACTIS (Stem) ×1 IMPLANT
SUT MNCRL AB 4-0 PS2 18 (SUTURE) ×2 IMPLANT
SUT STRATAFIX 0 PDS 27 VIOLET (SUTURE) ×2
SUT VIC AB 1 CT1 36 (SUTURE) ×6 IMPLANT
SUT VIC AB 2-0 CT1 27 (SUTURE) ×4
SUT VIC AB 2-0 CT1 TAPERPNT 27 (SUTURE) ×2 IMPLANT
SUTURE STRATFX 0 PDS 27 VIOLET (SUTURE) ×1 IMPLANT
TRAY FOLEY MTR SLVR 16FR STAT (SET/KITS/TRAYS/PACK) IMPLANT
TUBE SUCTION HIGH CAP CLEAR NV (SUCTIONS) ×2 IMPLANT
WATER STERILE IRR 1000ML POUR (IV SOLUTION) ×2 IMPLANT

## 2020-12-13 NOTE — Interval H&P Note (Signed)
History and Physical Interval Note:  12/13/2020 1:35 PM  Deanna Curtis  has presented today for surgery, with the diagnosis of Right hip osteoarthritis.  The various methods of treatment have been discussed with the patient and family. After consideration of risks, benefits and other options for treatment, the patient has consented to  Procedure(s): TOTAL HIP ARTHROPLASTY ANTERIOR APPROACH (Right) as a surgical intervention.  The patient's history has been reviewed, patient examined, no change in status, stable for surgery.  I have reviewed the patient's chart and labs.  Questions were answered to the patient's satisfaction.     Shelda Pal

## 2020-12-13 NOTE — Discharge Instructions (Addendum)
INSTRUCTIONS AFTER JOINT REPLACEMENT   ** We will manage your pain post-operatively for the first 2-4 weeks  During this time, do NOT use your normal Percocet 10 mg. Please put this in a safe location for you to resume when you have weaned off of our medication.   We will send in pain medication for 5-7 days at a time, please let us know when you have run out.   Remove items at home which could result in a fall. This includes throw rugs or furniture in walking pathways ICE to the affected joint every three hours while awake for 30 minutes at a time, for at least the first 3-5 days, and then as needed for pain and swelling.  Continue to use ice for pain and swelling. You may notice swelling that will progress down to the foot and ankle.  This is normal after surgery.  Elevate your leg when you are not up walking on it.   Continue to use the breathing machine you got in the hospital (incentive spirometer) which will help keep your temperature down.  It is common for your temperature to cycle up and down following surgery, especially at night when you are not up moving around and exerting yourself.  The breathing machine keeps your lungs expanded and your temperature down.   DIET:  As you were doing prior to hospitalization, we recommend a well-balanced diet.  DRESSING / WOUND CARE / SHOWERING  Keep the surgical dressing until follow up.  The dressing is water proof, so you can shower without any extra covering.  IF THE DRESSING FALLS OFF or the wound gets wet inside, change the dressing with sterile gauze.  Please use good hand washing techniques before changing the dressing.  Do not use any lotions or creams on the incision until instructed by your surgeon.    ACTIVITY  Increase activity slowly as tolerated, but follow the weight bearing instructions below.   No driving for 6 weeks or until further direction given by your physician.  You cannot drive while taking narcotics.  No lifting or  carrying greater than 10 lbs. until further directed by your surgeon. Avoid periods of inactivity such as sitting longer than an hour when not asleep. This helps prevent blood clots.  You may return to work once you are authorized by your doctor.     WEIGHT BEARING   Weight bearing as tolerated with assist device (walker, cane, etc) as directed, use it as long as suggested by your surgeon or therapist, typically at least 4-6 weeks.   EXERCISES  Results after joint replacement surgery are often greatly improved when you follow the exercise, range of motion and muscle strengthening exercises prescribed by your doctor. Safety measures are also important to protect the joint from further injury. Any time any of these exercises cause you to have increased pain or swelling, decrease what you are doing until you are comfortable again and then slowly increase them. If you have problems or questions, call your caregiver or physical therapist for advice.   Rehabilitation is important following a joint replacement. After just a few days of immobilization, the muscles of the leg can become weakened and shrink (atrophy).  These exercises are designed to build up the tone and strength of the thigh and leg muscles and to improve motion. Often times heat used for twenty to thirty minutes before working out will loosen up your tissues and help with improving the range of motion but do not use  heat for the first two weeks following surgery (sometimes heat can increase post-operative swelling).   These exercises can be done on a training (exercise) mat, on the floor, on a table or on a bed. Use whatever works the best and is most comfortable for you.    Use music or television while you are exercising so that the exercises are a pleasant break in your day. This will make your life better with the exercises acting as a break in your routine that you can look forward to.   Perform all exercises about fifteen times,  three times per day or as directed.  You should exercise both the operative leg and the other leg as well.  Exercises include:   Quad Sets - Tighten up the muscle on the front of the thigh (Quad) and hold for 5-10 seconds.   Straight Leg Raises - With your knee straight (if you were given a brace, keep it on), lift the leg to 60 degrees, hold for 3 seconds, and slowly lower the leg.  Perform this exercise against resistance later as your leg gets stronger.  Leg Slides: Lying on your back, slowly slide your foot toward your buttocks, bending your knee up off the floor (only go as far as is comfortable). Then slowly slide your foot back down until your leg is flat on the floor again.  Angel Wings: Lying on your back spread your legs to the side as far apart as you can without causing discomfort.  Hamstring Strength:  Lying on your back, push your heel against the floor with your leg straight by tightening up the muscles of your buttocks.  Repeat, but this time bend your knee to a comfortable angle, and push your heel against the floor.  You may put a pillow under the heel to make it more comfortable if necessary.   A rehabilitation program following joint replacement surgery can speed recovery and prevent re-injury in the future due to weakened muscles. Contact your doctor or a physical therapist for more information on knee rehabilitation.    CONSTIPATION  Constipation is defined medically as fewer than three stools per week and severe constipation as less than one stool per week.  Even if you have a regular bowel pattern at home, your normal regimen is likely to be disrupted due to multiple reasons following surgery.  Combination of anesthesia, postoperative narcotics, change in appetite and fluid intake all can affect your bowels.   YOU MUST use at least one of the following options; they are listed in order of increasing strength to get the job done.  They are all available over the counter, and  you may need to use some, POSSIBLY even all of these options:    Drink plenty of fluids (prune juice may be helpful) and high fiber foods Colace 100 mg by mouth twice a day  Senokot for constipation as directed and as needed Dulcolax (bisacodyl), take with full glass of water  Miralax (polyethylene glycol) once or twice a day as needed.  If you have tried all these things and are unable to have a bowel movement in the first 3-4 days after surgery call either your surgeon or your primary doctor.    If you experience loose stools or diarrhea, hold the medications until you stool forms back up.  If your symptoms do not get better within 1 week or if they get worse, check with your doctor.  If you experience "the worst abdominal pain ever" or  develop nausea or vomiting, please contact the office immediately for further recommendations for treatment.   ITCHING:  If you experience itching with your medications, try taking only a single pain pill, or even half a pain pill at a time.  You can also use Benadryl over the counter for itching or also to help with sleep.   TED HOSE STOCKINGS:  Use stockings on both legs until for at least 2 weeks or as directed by physician office. They may be removed at night for sleeping.  MEDICATIONS:  See your medication summary on the "After Visit Summary" that nursing will review with you.  You may have some home medications which will be placed on hold until you complete the course of blood thinner medication.  It is important for you to complete the blood thinner medication as prescribed.  PRECAUTIONS:  If you experience chest pain or shortness of breath - call 911 immediately for transfer to the hospital emergency department.   If you develop a fever greater that 101 F, purulent drainage from wound, increased redness or drainage from wound, foul odor from the wound/dressing, or calf pain - CONTACT YOUR SURGEON.                                                    FOLLOW-UP APPOINTMENTS:  If you do not already have a post-op appointment, please call the office for an appointment to be seen by your surgeon.  Guidelines for how soon to be seen are listed in your "After Visit Summary", but are typically between 1-4 weeks after surgery.  OTHER INSTRUCTIONS:   Knee Replacement:  Do not place pillow under knee, focus on keeping the knee straight while resting. CPM instructions: 0-90 degrees, 2 hours in the morning, 2 hours in the afternoon, and 2 hours in the evening. Place foam block, curve side up under heel at all times except when in CPM or when walking.  DO NOT modify, tear, cut, or change the foam block in any way.  POST-OPERATIVE OPIOID TAPER INSTRUCTIONS: It is important to wean off of your opioid medication as soon as possible. If you do not need pain medication after your surgery it is ok to stop day one. Opioids include: Codeine, Hydrocodone(Norco, Vicodin), Oxycodone(Percocet, oxycontin) and hydromorphone amongst others.  Long term and even short term use of opiods can cause: Increased pain response Dependence Constipation Depression Respiratory depression And more.  Withdrawal symptoms can include Flu like symptoms Nausea, vomiting And more Techniques to manage these symptoms Hydrate well Eat regular healthy meals Stay active Use relaxation techniques(deep breathing, meditating, yoga) Do Not substitute Alcohol to help with tapering If you have been on opioids for less than two weeks and do not have pain than it is ok to stop all together.  Plan to wean off of opioids This plan should start within one week post op of your joint replacement. Maintain the same interval or time between taking each dose and first decrease the dose.  Cut the total daily intake of opioids by one tablet each day Next start to increase the time between doses. The last dose that should be eliminated is the evening dose.   MAKE SURE YOU:  Understand these  instructions.  Get help right away if you are not doing well or get worse.    Thank you  for letting us be a part of your medical care team.  It is a privilege we respect greatly.  We hope these instructions will help you stay on track for a fast and full recovery!

## 2020-12-13 NOTE — Anesthesia Procedure Notes (Signed)
Procedure Name: Intubation Date/Time: 12/13/2020 3:56 PM Performed by: Barclay Lennox D, CRNA Pre-anesthesia Checklist: Patient identified, Emergency Drugs available, Suction available and Patient being monitored Patient Re-evaluated:Patient Re-evaluated prior to induction Oxygen Delivery Method: Circle system utilized Preoxygenation: Pre-oxygenation with 100% oxygen Induction Type: IV induction Ventilation: Mask ventilation without difficulty Laryngoscope Size: Mac and 4 Grade View: Grade I Tube type: Oral Tube size: 7.0 mm Number of attempts: 1 Airway Equipment and Method: Stylet Placement Confirmation: ETT inserted through vocal cords under direct vision, positive ETCO2 and breath sounds checked- equal and bilateral Secured at: 22 cm Tube secured with: Tape Dental Injury: Teeth and Oropharynx as per pre-operative assessment

## 2020-12-13 NOTE — Anesthesia Postprocedure Evaluation (Signed)
Anesthesia Post Note  Patient: Deanna Curtis  Procedure(s) Performed: TOTAL HIP ARTHROPLASTY ANTERIOR APPROACH (Right: Hip)     Patient location during evaluation: PACU Anesthesia Type: General Level of consciousness: awake and alert Pain management: pain level controlled Vital Signs Assessment: post-procedure vital signs reviewed and stable Respiratory status: spontaneous breathing, nonlabored ventilation, respiratory function stable and patient connected to nasal cannula oxygen Cardiovascular status: blood pressure returned to baseline and stable Postop Assessment: no apparent nausea or vomiting Anesthetic complications: no   No notable events documented.  Last Vitals:  Vitals:   12/13/20 1915 12/13/20 1955  BP: (!) 114/57 118/77  Pulse: 69 65  Resp: (!) 7 14  Temp: 36.7 C 36.6 C  SpO2: 100% 100%    Last Pain:  Vitals:   12/13/20 2010  TempSrc:   PainSc: 2                  Shelton Silvas

## 2020-12-13 NOTE — Transfer of Care (Signed)
Immediate Anesthesia Transfer of Care Note  Patient: QUINNIE BARCELO  Procedure(s) Performed: TOTAL HIP ARTHROPLASTY ANTERIOR APPROACH (Right: Hip)  Patient Location: PACU  Anesthesia Type:General  Level of Consciousness: awake, alert , oriented and patient cooperative  Airway & Oxygen Therapy: Patient Spontanous Breathing and Patient connected to face mask oxygen  Post-op Assessment: Report given to RN and Post -op Vital signs reviewed and stable  Post vital signs: Reviewed and stable  Last Vitals:  Vitals Value Taken Time  BP 139/93 12/13/20 1740  Temp    Pulse 97 12/13/20 1745  Resp 11 12/13/20 1745  SpO2 96 % 12/13/20 1745  Vitals shown include unvalidated device data.  Last Pain:  Vitals:   12/13/20 1355  TempSrc: Oral  PainSc:          Complications: No notable events documented.

## 2020-12-13 NOTE — Anesthesia Preprocedure Evaluation (Addendum)
Anesthesia Evaluation  Patient identified by MRN, date of birth, ID band Patient awake    Reviewed: Allergy & Precautions, NPO status , Patient's Chart, lab work & pertinent test results  Airway Mallampati: II  TM Distance: >3 FB Neck ROM: Full    Dental  (+) Teeth Intact, Dental Advisory Given   Pulmonary neg pulmonary ROS,    breath sounds clear to auscultation       Cardiovascular hypertension, Pt. on medications  Rhythm:Regular Rate:Normal + Systolic murmurs    Neuro/Psych negative neurological ROS  negative psych ROS   GI/Hepatic Neg liver ROS, GERD  Medicated,  Endo/Other  negative endocrine ROS  Renal/GU negative Renal ROS     Musculoskeletal  (+) Arthritis ,   Abdominal Normal abdominal exam  (+)   Peds  Hematology negative hematology ROS (+)   Anesthesia Other Findings   Reproductive/Obstetrics                            Anesthesia Physical Anesthesia Plan  ASA: 2  Anesthesia Plan: General   Post-op Pain Management:    Induction: Intravenous  PONV Risk Score and Plan: 4 or greater and Ondansetron, Midazolam, Scopolamine patch - Pre-op and Dexamethasone  Airway Management Planned: Oral ETT  Additional Equipment: None  Intra-op Plan:   Post-operative Plan: Extubation in OR  Informed Consent: I have reviewed the patients History and Physical, chart, labs and discussed the procedure including the risks, benefits and alternatives for the proposed anesthesia with the patient or authorized representative who has indicated his/her understanding and acceptance.     Dental advisory given  Plan Discussed with: CRNA  Anesthesia Plan Comments: (Systolic murmur noted on exam, unable to find Echo in the system. Will proceed with GA instead of spinal.    Lab Results      Component                Value               Date                      WBC                      7.2                  11/30/2020                HGB                      11.9 (L)            11/30/2020                HCT                      37.2                11/30/2020                MCV                      92.8                11/30/2020                PLT  257                 11/30/2020           )       Anesthesia Quick Evaluation

## 2020-12-13 NOTE — H&P (Signed)
TOTAL HIP ADMISSION H&P  Patient is admitted for right total hip arthroplasty.  Subjective:  Chief Complaint: right hip pain  HPI: Deanna Curtis, 50 y.o. female, has a history of pain and functional disability in the right hip(s) due to arthritis and patient has failed non-surgical conservative treatments for greater than 12 weeks to include NSAID's and/or analgesics and activity modification.  Onset of symptoms was gradual starting >10 years ago with gradually worsening course since that time.The patient noted prior procedures of the hip to include SCFE fixation  on the right hip(s).  Patient currently rates pain in the right hip at 9 out of 10 with activity. Patient has worsening of pain with activity and weight bearing and pain with passive range of motion. Patient has evidence of joint space narrowing by imaging studies. This condition presents safety issues increasing the risk of falls. There is no current active infection.  Patient Active Problem List   Diagnosis Date Noted   Tibial plateau fracture, left 10/02/2013   Past Medical History:  Diagnosis Date   Arthritis    Heart murmur    Hypertension    Tibial plateau fracture, left 10/02/2013    Past Surgical History:  Procedure Laterality Date   HIP ARTHROSCOPY  1987   lt    HIP SURGERY  2004   total hip-left   ORIF TIBIA PLATEAU Left 10/02/2013   Procedure: LEFT OPEN REDUCTION INTERNAL FIXATION (ORIF) TIBIAL PLATEAU;  Surgeon: Eulas Post, MD;  Location: [facility];  Service: Orthopedics;  Laterality: Left;    No current facility-administered medications for this encounter.   Current Outpatient Medications  Medication Sig Dispense Refill Last Dose   albuterol (PROVENTIL HFA;VENTOLIN HFA) 108 (90 Base) MCG/ACT inhaler Inhale 2 puffs into the lungs every 6 (six) hours as needed for wheezing or shortness of breath. 1 Inhaler 0    bisoprolol-hydrochlorothiazide (ZIAC) 5-6.25 MG per tablet Take 1 tablet by  mouth at bedtime.      carbamide peroxide (DEBROX) 6.5 % OTIC solution Place 5 drops into the right ear 2 (two) times daily. 15 mL 0    fluticasone furoate-vilanterol (BREO ELLIPTA) 200-25 MCG/INH AEPB Inhale 1 puff into the lungs daily. 60 each 2    methocarbamol (ROBAXIN) 500 MG tablet Take 1 tablet (500 mg total) by mouth 4 (four) times daily. (Patient not taking: Reported on 06/20/2018) 75 tablet 1    montelukast (SINGULAIR) 10 MG tablet Take 1 tablet (10 mg total) by mouth at bedtime. 90 tablet 1    oxyCODONE-acetaminophen (PERCOCET) 10-325 MG per tablet Take 1-2 tablets by mouth every 6 (six) hours as needed for pain. MAXIMUM TOTAL ACETAMINOPHEN DOSE IS 4000 MG PER DAY (Patient not taking: Reported on 06/20/2018) 75 tablet 0    oxyCODONE-acetaminophen (PERCOCET/ROXICET) 5-325 MG tablet Take by mouth.      promethazine (PHENERGAN) 25 MG tablet Take 1 tablet (25 mg total) by mouth every 6 (six) hours as needed for nausea or vomiting. 30 tablet 0    No Known Allergies  Social History   Tobacco Use   Smoking status: Never   Smokeless tobacco: Never  Substance Use Topics   Alcohol use: Yes    Comment: social    Family History  Problem Relation Age of Onset   Diabetes Mother    Hypertension Mother    Brain cancer Mother      Review of Systems  Constitutional:  Negative for chills and fever.  Respiratory:  Negative for  cough and shortness of breath.   Cardiovascular:  Negative for chest pain.  Gastrointestinal:  Negative for nausea and vomiting.  Musculoskeletal:  Positive for arthralgias.   Objective:  Physical Exam Well nourished and well developed. General: Alert and oriented x3, cooperative and pleasant, no acute distress. Head: normocephalic, atraumatic, neck supple. Eyes: EOMI.  Musculoskeletal: Right hip exam: Based on her radiographic appearance I did not stress range of motion assessment both passively or actively. She has an external rotation contracture  Calves  soft and nontender. Motor function intact in LE. Strength 5/5 LE bilaterally. Neuro: Distal pulses 2+. Sensation to light touch intact in LE.  Vital signs in last 24 hours:    Labs:   Estimated body mass index is 24.21 kg/m as calculated from the following:   Height as of 11/30/20: 5\' 6"  (1.676 m).   Weight as of 11/30/20: 68 kg.   Imaging Review Plain radiographs demonstrate severe degenerative joint disease of the right hip(s). The bone quality appears to be adequate for age and reported activity level.      Assessment/Plan:  End stage arthritis, right hip(s)  The patient history, physical examination, clinical judgement of the provider and imaging studies are consistent with end stage degenerative joint disease of the right hip(s) and total hip arthroplasty is deemed medically necessary. The treatment options including medical management, injection therapy, arthroscopy and arthroplasty were discussed at length. The risks and benefits of total hip arthroplasty were presented and reviewed. The risks due to aseptic loosening, infection, stiffness, dislocation/subluxation,  thromboembolic complications and other imponderables were discussed.  The patient acknowledged the explanation, agreed to proceed with the plan and consent was signed. Patient is being admitted for inpatient treatment for surgery, pain control, PT, OT, prophylactic antibiotics, VTE prophylaxis, progressive ambulation and ADL's and discharge planning.The patient is planning to be discharged  home.  Therapy Plans: HEP Disposition: Home with kids Planned DVT Prophylaxis: aspirin 81mg  BID DME needed: 3-n-1 PCP: Dr. 12/02/20 TXA: IV Allergies: NKDA Anesthesia Concerns: none BMI: 24.3 Last HgbA1c: Not diabetic Other: - Percocet 10 mg q6h PRN - - Has chronic cough - Significant HTN recently - has discussed with PCP (145/90 in office today)   Leodis Rains, PA-C Orthopedic Surgery [facility]  [place] [phone]

## 2020-12-14 ENCOUNTER — Other Ambulatory Visit: Payer: Self-pay

## 2020-12-14 DIAGNOSIS — M1611 Unilateral primary osteoarthritis, right hip: Secondary | ICD-10-CM | POA: Diagnosis not present

## 2020-12-14 LAB — BASIC METABOLIC PANEL
Anion gap: 9 (ref 5–15)
BUN: 13 mg/dL (ref 6–20)
CO2: 23 mmol/L (ref 22–32)
Calcium: 9.1 mg/dL (ref 8.9–10.3)
Chloride: 106 mmol/L (ref 98–111)
Creatinine, Ser: 0.73 mg/dL (ref 0.44–1.00)
GFR, Estimated: >60 mL/min (ref >60)
Glucose, Bld: 140 mg/dL — ABNORMAL HIGH (ref 70–99)
Potassium: 3.8 mmol/L (ref 3.5–5.1)
Sodium: 138 mmol/L (ref 135–145)

## 2020-12-14 LAB — CBC
HCT: 29.1 % — ABNORMAL LOW (ref 36.0–46.0)
Hemoglobin: 9.6 g/dL — ABNORMAL LOW (ref 12.0–15.0)
MCH: 29.9 pg (ref 26.0–34.0)
MCHC: 33 g/dL (ref 30.0–36.0)
MCV: 90.7 fL (ref 80.0–100.0)
Platelets: 223 10*3/uL (ref 150–400)
RBC: 3.21 MIL/uL — ABNORMAL LOW (ref 3.87–5.11)
RDW: 12.1 % (ref 11.5–15.5)
WBC: 11.1 10*3/uL — ABNORMAL HIGH (ref 4.0–10.5)
nRBC: 0 % (ref 0.0–0.2)

## 2020-12-14 MED ORDER — ASPIRIN 81 MG PO CHEW: 81.0000 mg | CHEWABLE_TABLET | Freq: Two times a day (BID) | ORAL | 0 refills | Status: AC

## 2020-12-14 MED ORDER — DOCUSATE SODIUM 100 MG PO CAPS: 100.0000 mg | ORAL_CAPSULE | Freq: Two times a day (BID) | ORAL | 0 refills | Status: AC

## 2020-12-14 MED ORDER — METHOCARBAMOL 500 MG PO TABS: 500.0000 mg | ORAL_TABLET | Freq: Four times a day (QID) | ORAL | 0 refills | Status: AC | PRN

## 2020-12-14 MED ORDER — ACETAMINOPHEN 325 MG PO TABS: 325.0000 mg | ORAL_TABLET | Freq: Four times a day (QID) | ORAL | Status: AC | PRN

## 2020-12-14 MED ORDER — OXYCODONE HCL 5 MG PO TABS: 5.0000 mg | ORAL_TABLET | ORAL | 0 refills | Status: AC | PRN

## 2020-12-14 MED ORDER — POLYETHYLENE GLYCOL 3350 17 G PO PACK: 17.0000 g | PACK | Freq: Every day | ORAL | 0 refills | Status: AC | PRN

## 2020-12-14 MED ORDER — CELECOXIB 200 MG PO CAPS: 200.0000 mg | ORAL_CAPSULE | Freq: Two times a day (BID) | ORAL | 0 refills | Status: AC

## 2020-12-14 NOTE — Op Note (Signed)
NAME:  Deanna Curtis.: [id number]      MEDICAL RECORD NO.: [id number]      FACILITY:  [facility]      PHYSICIAN:  Shelda Pal  DATE OF BIRTH:  1971/03/25     DATE OF PROCEDURE:  12/14/2020                                 OPERATIVE REPORT         PREOPERATIVE DIAGNOSIS: Right  hip osteoarthritis.      POSTOPERATIVE DIAGNOSIS:  Right hip osteoarthritis.      PROCEDURE:  Right total hip replacement through an anterior approach   utilizing DePuy THR system, component size 52 mm pinnacle cup, a size 36+4 neutral   Altrex liner, a size 6 Hi Actis stem with a 36+5 delta ceramic   ball.      SURGEON:  Madlyn Frankel. Charlann Boxer, M.D.      ASSISTANT:  Rosalene Billings, PA-C     ANESTHESIA:  Spinal.      SPECIMENS:  None.      COMPLICATIONS:  None.      BLOOD LOSS:  500 cc     DRAINS:  None.      INDICATION OF THE PROCEDURE:  Deanna Curtis is a 50 y.o. female who had   presented to office for evaluation of right hip pain.  Radiographs revealed   progressive degenerative changes with bone-on-bone   articulation of the  hip joint, including subchondral cystic changes and osteophytes.  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 The patient had painful limited range of   motion significantly affecting their overall quality of life and function.  The patient was failing to    respond to conservative measures including medications and/or injections and activity modification and at this point was ready   to proceed with more definitive measures.  Consent was obtained for   benefit of pain relief.  Specific risks of infection, DVT, component   failure, dislocation, neurovascular injury, and need for revision surgery were reviewed in the office.    Additional history of left total hip replacement  PROCEDURE IN DETAIL:  The patient was brought to operative theater.   Once adequate  anesthesia, preoperative antibiotics, 2 gm of Ancef, 1 gm of Tranexamic Acid, and 10 mg of Decadron were administered, the patient was positioned supine on the [employer] table.  Once the patient was safely positioned with adequate padding of boney prominences we predraped out the hip, and used fluoroscopy to confirm orientation of the pelvis.      The right hip was then prepped and draped from proximal iliac crest to   mid thigh with a shower curtain technique.      Time-out was performed identifying the patient, planned procedure, and the appropriate extremity.     An incision was then made 2 cm lateral to the   anterior superior iliac spine extending over the orientation of the   tensor fascia lata muscle and sharp dissection was carried down to the   fascia of the muscle.      The fascia was then incised.  The muscle belly was identified and swept   laterally and retractor placed along the  superior neck.  Following   cauterization of the circumflex vessels and removing some pericapsular   fat, a second cobra retractor was placed on the inferior neck.  A T-capsulotomy was made along the line of the   superior neck to the trochanteric fossa, then extended proximally and   distally.  Tag sutures were placed and the retractors were then placed   intracapsular.  We then identified the trochanteric fossa and   orientation of my neck cut and then made a neck osteotomy with the femur on traction.  The femoral   head was removed without difficulty or complication.  Traction was let   off and retractors were placed posterior and anterior around the   acetabulum.      The labrum and foveal tissue were debrided.  I began reaming with a 45 mm   reamer and reamed up to 51 mm reamer with good bony bed preparation and a 52 mm  cup was chosen.  The final 52 mm Pinnacle cup was then impacted under fluoroscopy to confirm the depth of penetration and orientation with respect to   Abduction and forward  flexion.  A screw was placed into the ilium followed by the hole eliminator.  The final   36+4 neutral Altrex liner was impacted with good visualized rim fit.  The cup was positioned anatomically within the acetabular portion of the pelvis.      At this point, the femur was rolled to 100 degrees.  Further capsule was   released off the inferior aspect of the femoral neck.  I then   released the superior capsule proximally.  With the leg in a neutral position the hook was placed laterally   along the femur under the vastus lateralis origin and elevated manually and then held in position using the hook attachment on the bed.  The leg was then extended and adducted with the leg rolled to 100   degrees of external rotation.  Retractors were placed along the medial calcar and posteriorly over the greater trochanter.  Once the proximal femur was fully   exposed, I used a box osteotome to set orientation.  I then began   broaching with the starting chili pepper broach and passed this by hand and then broached up to 6.  With the 6 broach in place I chose a high offset neck and did several trial reductions.  The offset was appropriate, leg lengths   appeared to be equal best matched with the +5 head ball trial confirmed radiographically.   Given these findings, I went ahead and dislocated the hip, repositioned all   retractors and positioned the right hip in the extended and abducted position.  The final 6 Hi Actis stem was   chosen and it was impacted down to the level of neck cut.  Based on this   and the trial reductions, a final 36+5 delta ceramic ball was chosen and   impacted onto a clean and dry trunnion, and the hip was reduced.  The   hip had been irrigated throughout the case again at this point.  I did   reapproximate the superior capsular leaflet to the anterior leaflet   using #1 Vicryl.  The fascia of the   tensor fascia lata muscle was then reapproximated using #1 Vicryl and #0 Stratafix  sutures.  The   remaining wound was closed with 2-0 Vicryl and running 4-0 Monocryl.   The hip was cleaned, dried, and dressed sterilely using Dermabond  and   Aquacel dressing.  The patient was then brought   to recovery room in stable condition tolerating the procedure well.    Rosalene Billings, PA-C was present for the entirety of the case involved from   preoperative positioning, perioperative retractor management, general   facilitation of the case, as well as primary wound closure as assistant.            Madlyn Frankel Charlann Boxer, M.D.        12/14/2020 7:34 AM

## 2020-12-14 NOTE — Plan of Care (Signed)
Plan of care initiated and discussed with the patient. 

## 2020-12-14 NOTE — Progress Notes (Signed)
Physical Therapy Treatment Patient Details Name: Deanna Curtis MRN: [id number] DOB: [birth year] Today's Date: 12/14/2020    History of Present Illness Pt is a 50 year old female s/p Right THA direct anterior approach on 12/13/20.    PT Comments    Pt ambulated in hallway and practiced safe stair technique.  Pt also performed LE exercises and provided with HEP handout. Pt is ready for d/c home today.    Follow Up Recommendations  Follow surgeon's recommendation for DC plan and follow-up therapies     Equipment Recommendations  None recommended by PT    Recommendations for Other Services       Precautions / Restrictions Precautions Precautions: Fall Restrictions Weight Bearing Restrictions: No    Mobility  Bed Mobility Overal bed mobility: Needs Assistance Bed Mobility: Supine to Sit;Sit to Supine     Supine to sit: Supervision Sit to supine: Supervision        Transfers Overall transfer level: Needs assistance Equipment used: Rolling walker (2 wheeled) Transfers: Sit to/from Stand Sit to Stand: Supervision         General transfer comment: verbal cues for hand placement  Ambulation/Gait Ambulation/Gait assistance: Supervision;Min guard Gait Distance (Feet): 300 Feet Assistive device: Rolling walker (2 wheeled) Gait Pattern/deviations: Step-to pattern;Decreased stance time - right;Antalgic     General Gait Details: verbal cues for sequence and step length   Stairs Stairs: Yes Stairs assistance: Min guard Stair Management: Step to pattern;Forwards;Two rails Number of Stairs: 3 General stair comments: verbal cues for sequence and safety; pt performed twice and reports understanding   Wheelchair Mobility    Modified Rankin (Stroke Patients Only)       Balance                                            Cognition Arousal/Alertness: Awake/alert Behavior During Therapy: WFL for tasks assessed/performed Overall Cognitive Status:  Within Functional Limits for tasks assessed                                        Exercises Total Joint Exercises Ankle Circles/Pumps: AROM;Both;10 reps Quad Sets: AROM;Right;10 reps Heel Slides: AAROM;Right;10 reps Hip ABduction/ADduction: AAROM;Right;10 reps Long Arc Quad: AROM;Seated;Right;10 reps Knee Flexion: AROM;Right;Standing;10 reps Marching in Standing: AROM;Right;Standing;10 reps Standing Hip Extension: AROM;Right;Standing;10 reps    General Comments        Pertinent Vitals/Pain Pain Assessment: 0-10 Pain Score: 4  Pain Location: right hip and groin Pain Descriptors / Indicators: Sore;Aching;Tightness Pain Intervention(s): Repositioned;Monitored during session;Premedicated before session    Home Living                      Prior Function            PT Goals (current goals can now be found in the care plan section) Progress towards PT goals: Progressing toward goals    Frequency    7X/week      PT Plan Current plan remains appropriate    Co-evaluation              AM-PAC PT "6 Clicks" Mobility   Outcome Measure  Help needed turning from your back to your side while in a flat bed without using bedrails?: A Little Help needed moving from lying on  your back to sitting on the side of a flat bed without using bedrails?: A Little Help needed moving to and from a bed to a chair (including a wheelchair)?: A Little Help needed standing up from a chair using your arms (e.g., wheelchair or bedside chair)?: A Little Help needed to walk in hospital room?: A Little Help needed climbing 3-5 steps with a railing? : A Little 6 Click Score: 18    End of Session Equipment Utilized During Treatment: Gait belt Activity Tolerance: Patient tolerated treatment well Patient left: in bed;with call bell/phone within reach Nurse Communication: Mobility status PT Visit Diagnosis: Difficulty in walking, not elsewhere classified (R26.2)      Time: 4158-3094 PT Time Calculation (min) (ACUTE ONLY): 30 min  Charges:  $Gait Training: 8-22 mins $Therapeutic Exercise: 8-22 mins                    Thomasene Mohair PT, DPT Acute Rehabilitation Services Pager: [phone] Office: [phone]   Luci Bank [place] 12/14/2020, 3:53 PM

## 2020-12-14 NOTE — Progress Notes (Signed)
Patient ID: Deanna Curtis, female   DOB: [birth year], 50 y.o.   MRN: [id number] Subjective: 1 Day Post-Op Procedure(s) (LRB): TOTAL HIP ARTHROPLASTY ANTERIOR APPROACH (Right)    Patient reports pain as mild to moderate. No events over night  Objective:   VITALS:   Vitals:   12/14/20 0228 12/14/20 0650  BP: 112/73 116/77  Pulse: 66 77  Resp: 16 16  Temp: 97.6 F (36.4 C) 97.6 F (36.4 C)  SpO2: 100% 99%    Neurovascular intact Incision: dressing C/D/I  LABS Recent Labs    12/14/20 0324  HGB 9.6*  HCT 29.1*  WBC 11.1*  PLT 223    Recent Labs    12/14/20 0324  NA 138  K 3.8  BUN 13  CREATININE 0.73  GLUCOSE 140*    No results for input(s): LABPT, INR in the last 72 hours.   Assessment/Plan: 1 Day Post-Op Procedure(s) (LRB): TOTAL HIP ARTHROPLASTY ANTERIOR APPROACH (Right)   Advance diet Up with therapy Plan for discharge to home after therapy later today RTC in 2 weeks

## 2020-12-14 NOTE — Evaluation (Signed)
Physical Therapy Evaluation Patient Details Name: Deanna Curtis MRN: [id number] DOB: Jan 16, 1971 Today's Date: 12/14/2020   History of Present Illness  Pt is a 50 year old female s/p Right THA direct anterior approach on 12/13/20.  Clinical Impression  Pt is s/p THA resulting in the deficits listed below (see PT Problem List). Pt will benefit from skilled PT to increase their independence and safety with mobility to allow discharge to the venue listed below. Pt ambulated in hallway and then requested back to bed end of session.  Initiated exercises however pt declined continuing due to fatigue.     Follow Up Recommendations Follow surgeon's recommendation for DC plan and follow-up therapies (plan for HEP)    Equipment Recommendations  None recommended by PT    Recommendations for Other Services       Precautions / Restrictions Precautions Precautions: Fall Restrictions Weight Bearing Restrictions: No      Mobility  Bed Mobility Overal bed mobility: Needs Assistance Bed Mobility: Sit to Supine       Sit to supine: Min guard   General bed mobility comments: verbal cues for self assist using gait belt, increased time and effort    Transfers Overall transfer level: Needs assistance Equipment used: Rolling walker (2 wheeled) Transfers: Sit to/from Stand Sit to Stand: Min guard         General transfer comment: verbal cues for hand placement  Ambulation/Gait Ambulation/Gait assistance: Min guard Gait Distance (Feet): 220 Feet Assistive device: Rolling walker (2 wheeled) Gait Pattern/deviations: Step-to pattern;Decreased stance time - right;Antalgic     General Gait Details: verbal cues for sequence and step length  Stairs            Wheelchair Mobility    Modified Rankin (Stroke Patients Only)       Balance                                             Pertinent Vitals/Pain Pain Assessment: 0-10 Pain Score: 4  Pain Location: right  hip Pain Descriptors / Indicators: Sore;Aching Pain Intervention(s): Premedicated before session;Repositioned;Monitored during session    Home Living Family/patient expects to be discharged to:: Private residence Living Arrangements: Children   Type of Home: Mobile home Home Access: Stairs to enter Entrance Stairs-Rails: Right Entrance Stairs-Number of Steps: 5-6 Home Layout: One level Home Equipment: Environmental consultant - 2 wheels      Prior Function Level of Independence: Independent               Hand Dominance        Extremity/Trunk Assessment        Lower Extremity Assessment Lower Extremity Assessment: Overall WFL for tasks assessed;LLE deficits/detail LLE Deficits / Details: required assist for movement due to pain       Communication      Cognition Arousal/Alertness: Awake/alert Behavior During Therapy: Flat affect Overall Cognitive Status: Within Functional Limits for tasks assessed                                        General Comments      Exercises     Assessment/Plan    PT Assessment Patient needs continued PT services  PT Problem List Decreased strength;Decreased mobility;Decreased activity tolerance;Decreased balance;Decreased knowledge of use of DME  PT Treatment Interventions Stair training;Gait training;DME instruction;Therapeutic exercise;Balance training;Functional mobility training;Therapeutic activities;Patient/family education    PT Goals (Current goals can be found in the Care Plan section)  Acute Rehab PT Goals PT Goal Formulation: With patient Time For Goal Achievement: 12/19/20 Potential to Achieve Goals: Good    Frequency 7X/week   Barriers to discharge        Co-evaluation               AM-PAC PT "6 Clicks" Mobility  Outcome Measure Help needed turning from your back to your side while in a flat bed without using bedrails?: A Little Help needed moving from lying on your back to sitting on the  side of a flat bed without using bedrails?: A Little Help needed moving to and from a bed to a chair (including a wheelchair)?: A Little Help needed standing up from a chair using your arms (e.g., wheelchair or bedside chair)?: A Little Help needed to walk in hospital room?: A Little Help needed climbing 3-5 steps with a railing? : A Little 6 Click Score: 18    End of Session Equipment Utilized During Treatment: Gait belt Activity Tolerance: Patient tolerated treatment well Patient left: in bed;with call bell/phone within reach Nurse Communication: Mobility status PT Visit Diagnosis: Difficulty in walking, not elsewhere classified (R26.2)    Time: 8280-0349 PT Time Calculation (min) (ACUTE ONLY): 19 min   Charges:   PT Evaluation $PT Eval Low Complexity: 1 Low        Kati PT, DPT Acute Rehabilitation Services Pager: [phone] Office: [phone]   Luci Bank [place] 12/14/2020, 11:22 AM

## 2020-12-14 NOTE — TOC Transition Note (Signed)
Transition of Care (TOC) - CM/SW Discharge Note   Patient Details  Name: Deanna Curtis MRN: [id number] Date of Birth: 06/21/1970  Transition of Care (TOC) CM/SW Contact:  HOYLE, LUCY, LCSW Phone Number: 12/14/2020, 10:09 AM   Clinical Narrative:    Met with pt and confirming receipt of 3n1 via [employer] (prearranged via MD office).  Plan HEP.  No further TOC needs.   Final next level of care: Home/Self Care Barriers to Discharge: No Barriers Identified   Patient Goals and CMS Choice Patient states their goals for this hospitalization and ongoing recovery are:: return home      Discharge Placement                       Discharge Plan and Services                DME Arranged: 3-N-1 DME Agency: [employer]                  Social Determinants of Health (SDOH) Interventions     Readmission Risk Interventions No flowsheet data found.     

## 2020-12-15 ENCOUNTER — Encounter (HOSPITAL_COMMUNITY): Payer: Self-pay | Admitting: Orthopedic Surgery

## 2020-12-19 NOTE — Discharge Summary (Signed)
Physician Discharge Summary   Patient ID: Deanna AmberDianne S Villavicencio MRN: [id number] DOB/AGE: 51/05/1970 51 y.o.  Admit date: 12/13/2020 Discharge date: 12/14/2020  Primary Diagnosis: Right hip osteoarthritis.   Admission Diagnoses:  Past Medical History:  Diagnosis Date   Arthritis    Heart murmur    Hypertension    Tibial plateau fracture, left 10/02/2013   Discharge Diagnoses:   Active Problems:   S/P right total hip arthroplasty  Estimated body mass index is 24.2 kg/m as calculated from the following:   Height as of this encounter: 5\' 6"  (1.676 m).   Weight as of this encounter: 68 kg.  Procedure:  Procedure(s) (LRB): TOTAL HIP ARTHROPLASTY ANTERIOR APPROACH (Right)   Consults: None  HPI: Deanna Curtis is a 50 y.o. female who had   presented to office for evaluation of right hip pain.  Radiographs revealed   progressive degenerative changes with bone-on-bone   articulation of the  hip joint, including subchondral cystic changes and osteophytes.  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 The patient had painful limited range of   motion significantly affecting their overall quality of life and function.  The patient was failing to    respond to conservative measures including medications and/or injections and activity modification and at this point was ready   to proceed with more definitive measures.  Consent was obtained for   benefit of pain relief.  Specific risks of infection, DVT, component   failure, dislocation, neurovascular injury, and need for revision surgery were reviewed in the office.  Laboratory Data: Admission on 12/13/2020, Discharged on 12/14/2020  Component Date Value Ref Range Status   Preg Test, Ur 12/13/2020 NEGATIVE  NEGATIVE Final   Comment:        THE SENSITIVITY OF THIS METHODOLOGY IS >20 mIU/mL. Performed at [facility], [address] [place], [place]  [id number]    WBC 12/14/2020 11.1 (A) 4.0 - 10.5 K/uL Final   RBC 12/14/2020 3.21 (A) 3.87 - 5.11 MIL/uL Final   Hemoglobin 12/14/2020 9.6 (A) 12.0 - 15.0 g/dL Final   HCT 30/86/578409/10/2020 29.1 (A) 36.0 - 46.0 % Final   MCV 12/14/2020 90.7  80.0 - 100.0 fL Final   MCH 12/14/2020 29.9  26.0 - 34.0 pg Final   MCHC 12/14/2020 33.0  30.0 - 36.0 g/dL Final   RDW 69/62/952809/10/2020 12.1  11.5 - 15.5 % Final   Platelets 12/14/2020 223  150 - 400 K/uL Final   nRBC 12/14/2020 0.0  0.0 - 0.2 % Final   Performed at [facility], [address] [place], [place] [id number]   Sodium 12/14/2020 138  135 - 145 mmol/L Final   Potassium 12/14/2020 3.8  3.5 - 5.1 mmol/L Final   Chloride 12/14/2020 106  98 - 111 mmol/L Final   CO2 12/14/2020 23  22 - 32 mmol/L Final   Glucose, Bld 12/14/2020 140 (A) 70 - 99 mg/dL Final   Glucose reference range applies only to samples taken after fasting for at least 8 hours.   BUN 12/14/2020 13  6 - 20 mg/dL Final   Creatinine, Ser 12/14/2020 0.73  0.44 - 1.00 mg/dL Final   Calcium 40/10/272509/10/2020 9.1  8.9 - 10.3 mg/dL Final   GFR, Estimated 12/14/2020 >60  >60 mL/min Final   Comment: (NOTE) Calculated using the CKD-EPI Creatinine Equation (2021)    Anion gap 12/14/2020 9  5 -  15 Final   Performed at [facility], [address] [place], [place] [address]  Orders Only on 12/09/2020  Component Date Value Ref Range Status   SARS Coronavirus 2 12/09/2020 RESULT: NEGATIVE   Final   Comment: RESULT: NEGATIVESARS-CoV-2 INTERPRETATION:A NEGATIVE  test result means that SARS-CoV-2 RNA was not present in the specimen above the limit of detection of this test. This does not preclude a possible SARS-CoV-2 infection and should not be used as the  sole basis for patient management decisions. Negative results must be combined with clinical observations, patient history, and epidemiological information. Optimum specimen types and timing for peak viral levels during  infections caused by SARS-CoV-2  have not been determined. Collection of multiple specimens or types of specimens may be necessary to detect virus. Improper specimen collection and handling, sequence variability under primers/probes, or organism present below the limit of detection may  lead to false negative results. Positive and negative predictive values of testing are highly dependent on prevalence. False negative test results are more likely when prevalence of disease is high.The expected result is NEGATIVE.Fact S                          heet for  Healthcare Providers: [employer] Sheet for Patients: https://poole-freeman.org/ Reference Range - Negative   Hospital Outpatient Visit on 11/30/2020  Component Date Value Ref Range Status   MRSA, PCR 11/30/2020 NEGATIVE  NEGATIVE Final   Staphylococcus aureus 11/30/2020 NEGATIVE  NEGATIVE Final   Comment: (NOTE) The Xpert SA Assay (FDA approved for NASAL specimens in patients 32 years of age and older), is one component of a comprehensive surveillance program. It is not intended to diagnose infection nor to guide or monitor treatment. Performed at [facility], [address] [place], [place] [address]    WBC 11/30/2020 7.2  4.0 - 10.5 K/uL Final   RBC 11/30/2020 4.01  3.87 - 5.11 MIL/uL Final   Hemoglobin 11/30/2020 11.9 (A) 12.0 - 15.0 g/dL Final   HCT 76/16/0737 37.2  36.0 - 46.0 % Final   MCV 11/30/2020 92.8  80.0 - 100.0 fL Final   MCH 11/30/2020 29.7  26.0 - 34.0 pg Final   MCHC 11/30/2020 32.0  30.0 - 36.0 g/dL Final   RDW 10/62/6948 12.5  11.5 - 15.5 % Final   Platelets 11/30/2020 257  150 - 400 K/uL Final   nRBC 11/30/2020 0.0  0.0 - 0.2 % Final   Performed at [facility], [address], [place], [place] [address]   Sodium 11/30/2020 141  135 - 145 mmol/L Final   Potassium 11/30/2020 3.4 (A) 3.5 - 5.1 mmol/L Final   Chloride  11/30/2020 110  98 - 111 mmol/L Final   CO2 11/30/2020 23  22 - 32 mmol/L Final   Glucose, Bld 11/30/2020 94  70 - 99 mg/dL Final   Glucose reference range applies only to samples taken after fasting for at least 8 hours.   BUN 11/30/2020 11  6 - 20 mg/dL Final   Creatinine, Ser 11/30/2020 0.65  0.44 - 1.00 mg/dL Final   Calcium 03/50/0938 10.0  8.9 - 10.3 mg/dL Final   Total Protein 18/29/9371 7.8  6.5 - 8.1 g/dL Final   Albumin 69/67/8938 4.3  3.5 - 5.0 g/dL Final   AST 01/23/5101 14 (A) 15 - 41 U/L Final   ALT 11/30/2020 17  0 - 44 U/L Final   Alkaline Phosphatase  11/30/2020 71  38 - 126 U/L Final   Total Bilirubin 11/30/2020 1.3 (A) 0.3 - 1.2 mg/dL Final   GFR, Estimated 11/30/2020 >60  >60 mL/min Final   Comment: (NOTE) Calculated using the CKD-EPI Creatinine Equation (2021)    Anion gap 11/30/2020 8  5 - 15 Final   Performed at [facility], [address], [place], [place] [address]   ABO/RH(D) 11/30/2020 B POS   Final   Antibody Screen 11/30/2020 NEG   Final   Sample Expiration 11/30/2020 12/14/2020,2359   Final   Extend sample reason 11/30/2020    Final                   Value:NO TRANSFUSIONS OR PREGNANCY IN THE PAST 3 MONTHS Performed at [facility], [address], [place], [place] [address]      X-Rays:DG Pelvis Portable  Result Date: 12/13/2020 CLINICAL DATA:  Status post right total hip arthroplasty. EXAM: PORTABLE PELVIS 1-2 VIEWS COMPARISON:  None. FINDINGS: Postop change from right total hip arthroplasty identified. There is gas identified within the soft tissues around the proximal right femur. No periprosthetic fracture or dislocation. Previous left hip arthroplasty device is again seen. Gaseous distension of the colon noted. IMPRESSION: Status post right total hip arthroplasty. Electronically Signed   By: Signa Kell M.D.   On: 12/13/2020 18:26   DG C-Arm 1-60 Min-No Report  Result Date: 12/13/2020 Fluoroscopy was utilized  by the requesting physician.  No radiographic interpretation.   DG HIP OPERATIVE UNILAT W OR W/O PELVIS RIGHT  Result Date: 12/13/2020 CLINICAL DATA:  Status post right total hip arthroplasty. EXAM: PORTABLE PELVIS 1-2 VIEWS COMPARISON:  None. FINDINGS: Postop change from right total hip arthroplasty identified. There is gas identified within the soft tissues around the proximal right femur. No periprosthetic fracture or dislocation. Previous left hip arthroplasty device is again seen. Gaseous distension of the colon noted. IMPRESSION: Status post right total hip arthroplasty. Electronically Signed   By: Signa Kell M.D.   On: 12/13/2020 18:26    EKG: Orders placed or performed during the hospital encounter of 11/30/20   EKG 12 lead per protocol   EKG 12 lead per protocol     Hospital Course: Deanna Curtis is a 50 y.o. who was admitted to [facility]. They were brought to the operating room on 12/13/2020 and underwent Procedure(s): TOTAL HIP ARTHROPLASTY ANTERIOR APPROACH.  Patient tolerated the procedure well and was later transferred to the recovery room and then to the orthopaedic floor for postoperative care. They were given PO and IV analgesics for pain control following their surgery. They were given 24 hours of postoperative antibiotics of  Anti-infectives (From admission, onward)    Start     Dose/Rate Route Frequency Ordered Stop   12/14/20 0600  ceFAZolin (ANCEF) IVPB 2g/100 mL premix        2 g 200 mL/hr over 30 Minutes Intravenous On call to O.R. 12/13/20 1326 12/14/20 0638   12/13/20 2200  ceFAZolin (ANCEF) IVPB 2g/100 mL premix        2 g 200 mL/hr over 30 Minutes Intravenous Every 6 hours 12/13/20 1952 12/14/20 1512      and started on DVT prophylaxis in the form of Aspirin.   PT and OT were ordered for total joint protocol. Discharge planning consulted to help with postop disposition and equipment needs.  Patient had a good night on the evening of surgery. They  started to  get up OOB with therapy on POD #1. Pt was seen during rounds and was ready to go home pending progress with therapy.She worked with therapy on POD #1 and was meeting her goals. Pt was discharged to home later that day in stable condition.  Diet: Regular diet Activity: WBAT Follow-up: in 2 weeks Disposition: Home Discharged Condition: good   Discharge Instructions     Call MD / Call 911   Complete by: As directed    If you experience chest pain or shortness of breath, CALL 911 and be transported to the hospital emergency room.  If you develope a fever above 101 F, pus (white drainage) or increased drainage or redness at the wound, or calf pain, call your surgeon's office.   Change dressing   Complete by: As directed    Maintain surgical dressing until follow up in the clinic. If the edges start to pull up, may reinforce with tape. If the dressing is no longer working, may remove and cover with gauze and tape, but must keep the area dry and clean.  Call with any questions or concerns.   Constipation Prevention   Complete by: As directed    Drink plenty of fluids.  Prune juice may be helpful.  You may use a stool softener, such as Colace (over the counter) 100 mg twice a day.  Use MiraLax (over the counter) for constipation as needed.   Diet - low sodium heart healthy   Complete by: As directed    Increase activity slowly as tolerated   Complete by: As directed    Weight bearing as tolerated with assist device (walker, cane, etc) as directed, use it as long as suggested by your surgeon or therapist, typically at least 4-6 weeks.   Post-operative opioid taper instructions:   Complete by: As directed    POST-OPERATIVE OPIOID TAPER INSTRUCTIONS: It is important to wean off of your opioid medication as soon as possible. If you do not need pain medication after your surgery it is ok to stop day one. Opioids include: Codeine, Hydrocodone(Norco, Vicodin), Oxycodone(Percocet, oxycontin)  and hydromorphone amongst others.  Long term and even short term use of opiods can cause: Increased pain response Dependence Constipation Depression Respiratory depression And more.  Withdrawal symptoms can include Flu like symptoms Nausea, vomiting And more Techniques to manage these symptoms Hydrate well Eat regular healthy meals Stay active Use relaxation techniques(deep breathing, meditating, yoga) Do Not substitute Alcohol to help with tapering If you have been on opioids for less than two weeks and do not have pain than it is ok to stop all together.  Plan to wean off of opioids This plan should start within one week post op of your joint replacement. Maintain the same interval or time between taking each dose and first decrease the dose.  Cut the total daily intake of opioids by one tablet each day Next start to increase the time between doses. The last dose that should be eliminated is the evening dose.      TED hose   Complete by: As directed    Use stockings (TED hose) for 2 weeks on both leg(s).  You may remove them at night for sleeping.      Allergies as of 12/14/2020   No Known Allergies      Medication List     STOP taking these medications    oxyCODONE-acetaminophen 7.5-325 MG tablet Commonly known as: PERCOCET       TAKE these medications  acetaminophen 325 MG tablet Commonly known as: TYLENOL Take 1-2 tablets (325-650 mg total) by mouth every 6 (six) hours as needed for mild pain (pain score 1-3 or temp > 100.5).   albuterol 108 (90 Base) MCG/ACT inhaler Commonly known as: VENTOLIN HFA Inhale 2 puffs into the lungs every 6 (six) hours as needed for wheezing or shortness of breath.   amLODipine 10 MG tablet Commonly known as: NORVASC Take 10 mg by mouth daily.   aspirin 81 MG chewable tablet Chew 1 tablet (81 mg total) by mouth 2 (two) times daily for 28 days.   celecoxib 200 MG capsule Commonly known as: CELEBREX Take 1 capsule  (200 mg total) by mouth 2 (two) times daily.   docusate sodium 100 MG capsule Commonly known as: COLACE Take 1 capsule (100 mg total) by mouth 2 (two) times daily.   fluticasone 50 MCG/ACT nasal spray Commonly known as: FLONASE Place 2 sprays into both nostrils daily as needed for allergies.   methocarbamol 500 MG tablet Commonly known as: ROBAXIN Take 1 tablet (500 mg total) by mouth every 6 (six) hours as needed for muscle spasms.   montelukast 10 MG tablet Commonly known as: SINGULAIR Take 1 tablet (10 mg total) by mouth at bedtime.   ondansetron 4 MG disintegrating tablet Commonly known as: ZOFRAN-ODT Take 4 mg by mouth 2 (two) times daily as needed for nausea.   oxyCODONE 5 MG immediate release tablet Commonly known as: Oxy IR/ROXICODONE Take 1-3 tablets (5-15 mg total) by mouth every 4 (four) hours as needed for severe pain. Do NOT use your normal Percocet 10 mg while taking this medication.   pantoprazole 40 MG tablet Commonly known as: PROTONIX Take 40 mg by mouth daily.   polyethylene glycol 17 g packet Commonly known as: MIRALAX / GLYCOLAX Take 17 g by mouth daily as needed for mild constipation.               Discharge Care Instructions  (From admission, onward)           Start     Ordered   12/14/20 0000  Change dressing       Comments: Maintain surgical dressing until follow up in the clinic. If the edges start to pull up, may reinforce with tape. If the dressing is no longer working, may remove and cover with gauze and tape, but must keep the area dry and clean.  Call with any questions or concerns.   12/14/20 0750            Follow-up Information     Durene Romans, MD. Schedule an appointment as soon as possible for a visit in 2 week(s).   Specialty: Orthopedic Surgery Contact information: [address] [place] [address] [phone]                 Signed: Dennie Bible, PA-C Orthopedic  Surgery 12/19/2020, 8:03 AM

## 2021-01-04 DIAGNOSIS — I517 Cardiomegaly: Secondary | ICD-10-CM | POA: Diagnosis not present

## 2021-01-05 ENCOUNTER — Other Ambulatory Visit: Payer: Self-pay | Admitting: Cardiology

## 2021-10-24 IMAGING — RF DG HIP (WITH PELVIS) OPERATIVE*R*
1 series · 2 of 2 positions shown · non-contrast
Comparison: None.

CLINICAL DATA: Status post right total hip arthroplasty.

EXAM:
PORTABLE PELVIS 1-2 VIEWS

[Series 1: unknown protocol · 0.20mm/px · 2 of 2 slices shown]
[im 1/2]
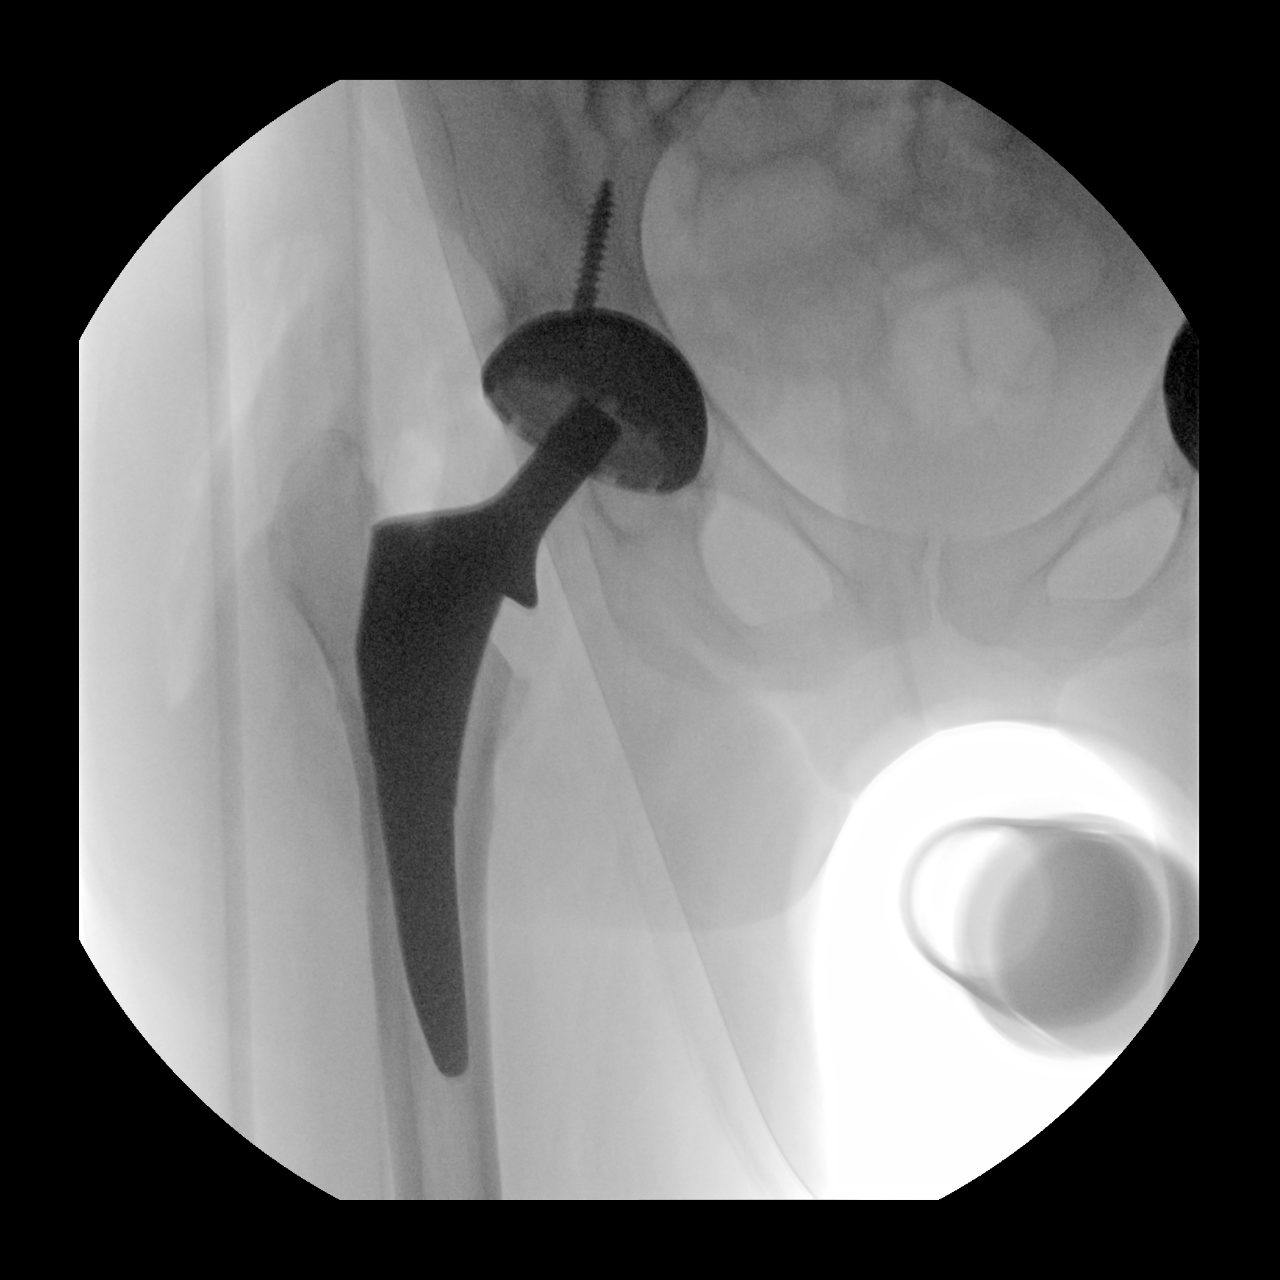
[im 2/2]
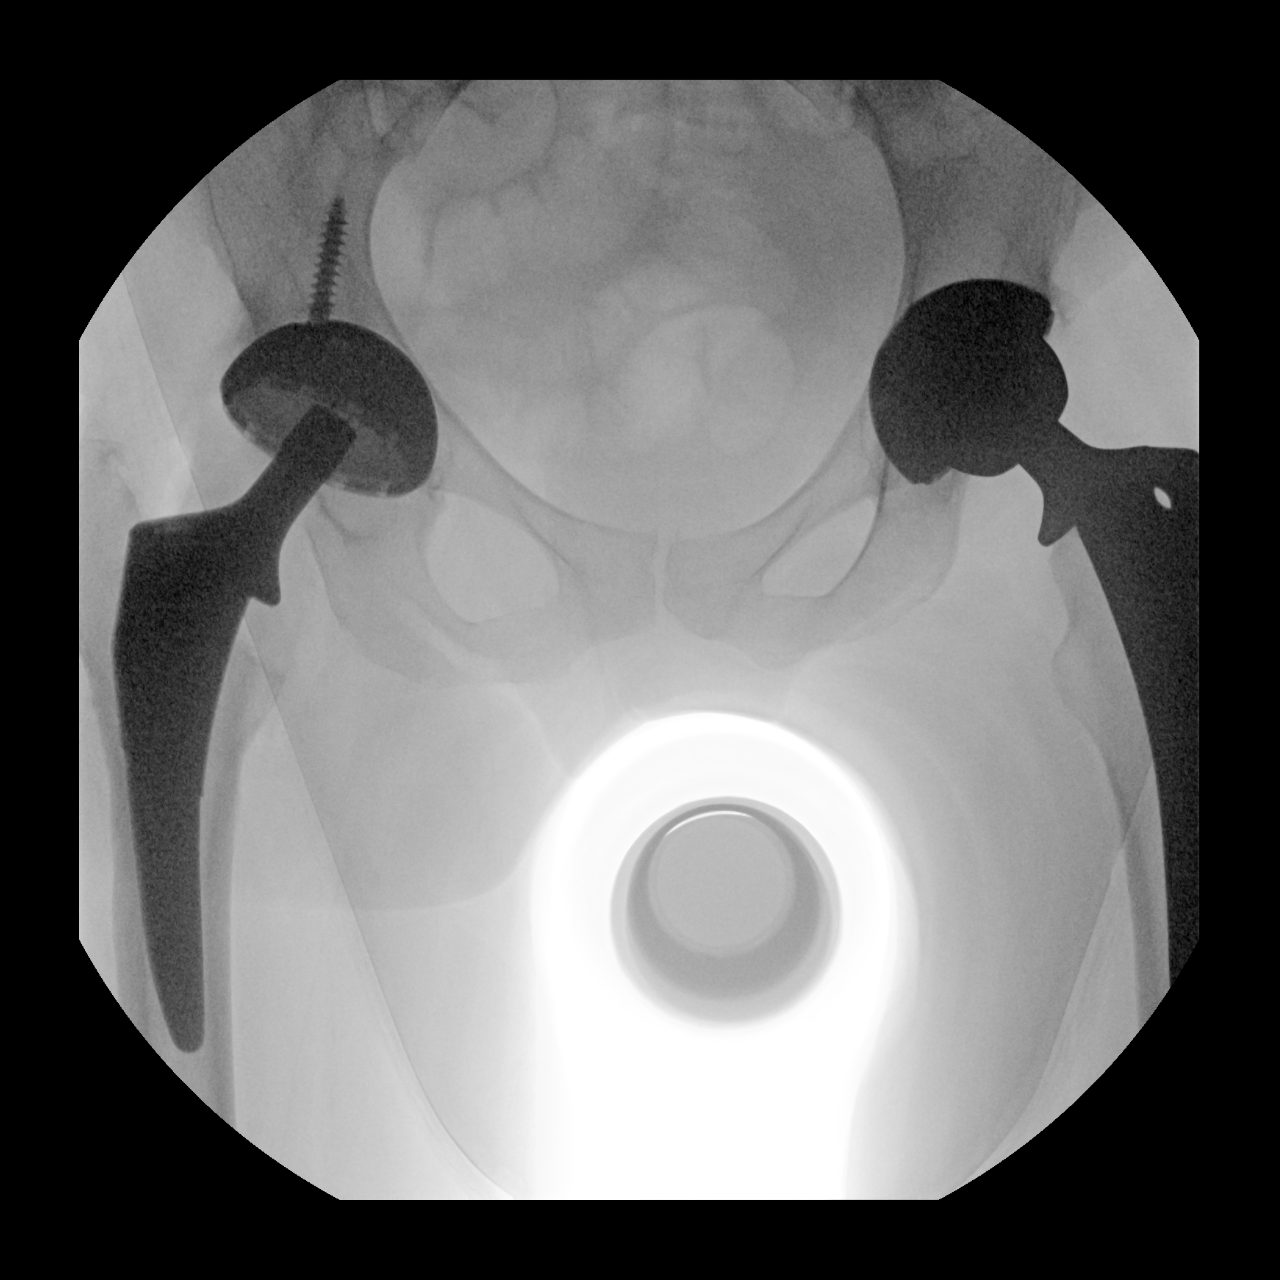

[2 of 2 positions shown; findings below may reference images not displayed]

FINDINGS: Postop change from right total hip arthroplasty identified. There is
gas identified within the soft tissues around the proximal right
femur. No periprosthetic fracture or dislocation. Previous left hip
arthroplasty device is again seen. Gaseous distension of the colon
noted.
IMPRESSION: Status post right total hip arthroplasty.
